# Patient Record
Sex: Female | Born: 1935 | Race: White | Hispanic: No | Marital: Married | Smoking: Never smoker
Health system: Southern US, Community
[De-identification: ages and names within clinical notes are randomized; demographics above are authoritative.]

## PROBLEM LIST (undated history)

## (undated) DIAGNOSIS — K219 Gastro-esophageal reflux disease without esophagitis: Secondary | ICD-10-CM

## (undated) DIAGNOSIS — I10 Essential (primary) hypertension: Secondary | ICD-10-CM

## (undated) DIAGNOSIS — I2510 Atherosclerotic heart disease of native coronary artery without angina pectoris: Secondary | ICD-10-CM

## (undated) DIAGNOSIS — M1990 Unspecified osteoarthritis, unspecified site: Secondary | ICD-10-CM

## (undated) DIAGNOSIS — R0602 Shortness of breath: Secondary | ICD-10-CM

## (undated) DIAGNOSIS — I219 Acute myocardial infarction, unspecified: Secondary | ICD-10-CM

## (undated) DIAGNOSIS — J189 Pneumonia, unspecified organism: Secondary | ICD-10-CM

## (undated) DIAGNOSIS — I509 Heart failure, unspecified: Secondary | ICD-10-CM

## (undated) DIAGNOSIS — E785 Hyperlipidemia, unspecified: Secondary | ICD-10-CM

## (undated) HISTORY — PX: BACK SURGERY: SHX140

## (undated) HISTORY — DX: Hyperlipidemia, unspecified: E78.5

## (undated) HISTORY — PX: CORONARY ANGIOPLASTY WITH STENT PLACEMENT: SHX49

## (undated) HISTORY — PX: OTHER SURGICAL HISTORY: SHX169

## (undated) HISTORY — PX: CORONARY ARTERY BYPASS GRAFT: SHX141

## (undated) HISTORY — PX: ABDOMINAL HYSTERECTOMY: SHX81

## (undated) HISTORY — PX: EYE SURGERY: SHX253

## (undated) HISTORY — DX: Essential (primary) hypertension: I10

---

## 2009-04-13 ENCOUNTER — Ambulatory Visit: Payer: Self-pay | Admitting: Internal Medicine

## 2009-04-13 DIAGNOSIS — R05 Cough: Secondary | ICD-10-CM

## 2009-04-13 DIAGNOSIS — J479 Bronchiectasis, uncomplicated: Secondary | ICD-10-CM

## 2009-04-13 DIAGNOSIS — E785 Hyperlipidemia, unspecified: Secondary | ICD-10-CM

## 2009-04-13 DIAGNOSIS — I10 Essential (primary) hypertension: Secondary | ICD-10-CM

## 2009-04-13 DIAGNOSIS — I4891 Unspecified atrial fibrillation: Secondary | ICD-10-CM | POA: Insufficient documentation

## 2009-04-13 DIAGNOSIS — A310 Pulmonary mycobacterial infection: Secondary | ICD-10-CM

## 2009-04-13 DIAGNOSIS — R079 Chest pain, unspecified: Secondary | ICD-10-CM

## 2010-02-15 NOTE — Assessment & Plan Note (Signed)
Summary: Pulmonary/ new pt eval   Primary Provider/Referring Provider:  Dr. Darcel Bayley at [facility] in [place], [place]  CC:  Pulmonary consult. the patient c/o cough and wheezing for several weeks.Marland Kitchen  History of Present Illness: 28 yowf never smoker with h/o cough off and on since around 2001 worse since cabg in [place] in 2007 complicated by pna and effusion and prolonged rehab.    April 13, 2009 cc variable severity daily throat  congestion and hoarseness no able to go mailbox and do house work including up and down steps unless gets in a rush.  no nocturnal c/os.   Main issue is throat congestion  and wheezing.  Larey Seat three weeks ago mild cp from fall has resolved but son concerned because she seems more wheezy and congested, even though she denies it affected her ex tol or sleep.  Pt denies any significant sore throat, dysphagia, itching, sneezing,  nasal obstruction,  fever, chills, sweats, unintended wt loss, pleuritic or exertional cp, hempoptysis, change in activity tolerance  orthopnea pnd or leg swelling.  says cloricidin and mucinex helps her more than anything else inclduing astepro per her pulmonary doctor in [place]. Pt also denies any obvious fluctuation in symptoms with weather or environmental change or other alleviating or aggravating factors.       Preventive Screening-Counseling & Management  Alcohol-Tobacco     Smoking Status: never  Current Medications (verified): 1)  Claritin 10 Mg Tabs (Loratadine) .Marland Kitchen.. 1 By Mouth Daily 2)  Vitamin D 1000 Unit Tabs (Cholecalciferol) .Marland Kitchen.. 1 By Mouth Daily 3)  Astepro 0.15 % Soln (Azelastine Hcl) .... As Needed 4)  Klor-Con M20 20 Meq Cr-Tabs (Potassium Chloride Crys Cr) .Marland Kitchen.. 1 By Mouth Two Times A Day 5)  Aspirin 81 Mg Tabs (Aspirin) .Marland Kitchen.. 1 By Mouth Daily 6)  Furosemide 40 Mg Tabs (Furosemide) .Marland Kitchen.. 1 By Mouth Daily 7)  Lipitor 20 Mg Tabs (Atorvastatin Calcium) .Marland Kitchen.. 1 By Mouth Daily 8)  Docusate Sodium 100 Mg Tabs (Docusate  Sodium) .Marland Kitchen.. 1 By Mouth Every 12 Hours 9)  Metoprolol Succinate 25 Mg Xr24h-Tab (Metoprolol Succinate) .Marland Kitchen.. 1 By Mouth Every 12 Hours 10)  Omeprazole 20 Mg Cpdr (Omeprazole) .... 2 By Mouth Daily 11)  Evista 60 Mg Tabs (Raloxifene Hcl) .Marland Kitchen.. 1 By Mouth Daily 12)  Levothyroxine Sodium 75 Mcg Tabs (Levothyroxine Sodium) .Marland Kitchen.. 1 By Mouth Daily 13)  Calcium Antacid Extra Strength 750 Mg Chew (Calcium Carbonate Antacid) .... 2 By Mouth Daily  Allergies (verified): 1)  ! 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) HYPERTENSION (ICD-401.9) HYPERLIPIDEMIA (ICD-272.4) Hx of ATRIAL FIBRILLATION (ICD-427.31) UPPER AIRWAY COUGH SYNDROME     - onset around 2001  Family History: Negative for respiratory diseases or atopy  heart dz father  Social History: Never smoker  Lives with husband able to go mailbox and do house work including up and down stepsSmoking Status:  never  Vital Signs:  Patient profile:   77 year old female Height:      61 inches (154.94 cm) Weight:      150 pounds (68.18 kg) BMI:     28.44 O2 Sat:      92 % on Room air Temp:     98.0 degrees F (36.67 degrees C) oral Pulse rate:   53 / minute BP sitting:   122 / 72  (left arm) Cuff size:   regular  Vitals Entered By: Michel Bickers CMA (April 13, 2009 11:52 AM)  O2 Sat at Rest %:  92 O2 Flow:  Room air CC: Pulmonary consult. the patient c/o cough and wheezing for several weeks.   Physical Exam  Additional Exam:  moderately frail wf who  failed to answer a single question asked in a straightforward manner, tending to go off on tangents or answer questions with ambiguous medical terms or diagnoses and seemed perturbed when asked the same question more than once for clarification and changed her chief complaint several times during the initial interview. HEENT: nl dentition, turbinates, and orophanx. Nl external ear canals without cough reflex NECK :  without JVD/Nodes/TM/ nl  carotid upstrokes bilaterally LUNGS: no acc muscle use, clear to A and P bilaterally without cough on insp or exp maneuvers CV:  RRR  no s3 or murmur or increase in P2, no edema  ABD:  soft and nontender with nl excursion in the supine position. No bruits or organomegaly, bowel sounds nl MS:  warm without deformities, calf tenderness, cyanosis or clubbing SKIN: warm and dry without lesions   NEURO:  alert, approp, no deficits     CXR  Procedure date:  04/13/2009  Findings:        Findings: The cardiac silhouette is upper limits of normal in size. The mediastinum and pulmonary vasculature are within normal limits. There is evidence of prior sternotomy and CABG.  Bronchiectasis and pulmonary interstitial fibrotic changes are noted, especially in the lower lungs.  There is left basilar pleural thickening.  The bones are osteopenic, and there are several vertebral wedge compression fractures, age indeterminate.  Atherosclerotic calcification is present within the thoracic aorta.   IMPRESSION: There is bronchiectasis and interstitial fibrotic change, especially in the bases with left lateral pleural thickening/scarring.  Impression & Recommendations:  Problem # 1:  COUGH (ICD-786.2) The most common causes of chronic cough in immunocompetent adults include: upper airway cough syndrome (UACS), previously referred to as postnasal drip syndrome,  caused by variety of rhinosinus conditions; (2) asthma; (3) GERD; (4) chronic bronchitis from cigarette smoking or other inhaled environmental irritants; (5) nonasthmatic eosinophilic bronchitis; and (6) bronchiectasis. These conditions, singly or in combination, have accounted for up to 94% of the causes of chronic cough in prospective studies.   This is most c/w Upper airway cough syndrome, so named because it's frequently impossible to sort out how much is LPR vs CR/sinusitis with freq throat clearing generating secondary extra esophageal GERD  from wide swings in gastric pressure that occur with throat clearing, promoting self use of mint and menthol lozenges that reduce the lower esophageal sphincter tone and exacerbate the problem further.  These symptoms are easily confused with asthma/copd by even experienced pulmonogists because they overlap so much. These are the same pts who not infrequently have failed to tolerate ace inhibitors,  dry powder inhalers or biphosphonates or report having reflux symptoms that don't respond to standard doses of PPI  For now try max rx at the acid component of gerd and add H1 blocker per cough guidelines.  Problem # 2:  CHEST PAIN (ICD-786.50) s/p blunt injury x 3 weeks no findings on exam and no acute changes on cxr so no need for additional w/u at this point  Problem # 3:  BRONCHIECTASIS W/O ACUTE EXACERBATION (ICD-494.0) Chest xray and hx are classic for bronchiectasis except that most of her coughing is upper airway in pattern which might be sinus related. If symtpoms continue would consider sinus ct and maybe chest ct/pft's but only if symptoms become more  limiting than what she is describing.  Medications Added to Medication List This Visit: 1)  Claritin 10 Mg Tabs (Loratadine) .Marland Kitchen.. 1 by mouth daily 2)  Vitamin D 1000 Unit Tabs (Cholecalciferol) .Marland Kitchen.. 1 by mouth daily 3)  Astepro 0.15 % Soln (Azelastine hcl) .... As needed 4)  Klor-con M20 20 Meq Cr-tabs (Potassium chloride crys cr) .Marland Kitchen.. 1 by mouth two times a day 5)  Aspirin 81 Mg Tabs (Aspirin) .Marland Kitchen.. 1 by mouth daily 6)  Furosemide 40 Mg Tabs (Furosemide) .Marland Kitchen.. 1 by mouth daily 7)  Lipitor 20 Mg Tabs (Atorvastatin calcium) .Marland Kitchen.. 1 by mouth daily 8)  Docusate Sodium 100 Mg Tabs (Docusate sodium) .Marland Kitchen.. 1 by mouth every 12 hours 9)  Metoprolol Succinate 25 Mg Xr24h-tab (Metoprolol succinate) .Marland Kitchen.. 1 by mouth every 12 hours 10)  Omeprazole 20 Mg Cpdr (Omeprazole) .... Take  two pills 30-60 min before first meal of the day 11)  Evista 60 Mg Tabs  (Raloxifene hcl) .Marland Kitchen.. 1 by mouth daily 12)  Levothyroxine Sodium 75 Mcg Tabs (Levothyroxine sodium) .Marland Kitchen.. 1 by mouth daily 13)  Calcium Antacid Extra Strength 750 Mg Chew (Calcium carbonate antacid) .... 2 by mouth daily 14)  Chlor-trimeton 4 Mg Tabs (Chlorpheniramine maleate) .... One every 6 hours as needed when feel throat drainage or congestion  Other Orders: New Patient Level V (57322) T-2 View CXR (71020TC)  Patient Instructions: 1)  for drainage congestion and cough I recommend you increase omeprazole to 20 mg 2 pills each am 30-60 min before first meal of the day and as needed use chlortrimeton 4 mg every 6 hours instead of coricidin or mucinex 2)  GERD (REFLUX)  is a common cause of respiratory symptoms. It commonly presents without heartburn and can be treated with medication, but also with lifestyle changes including avoidance of late meals, excessive alcohol, smoking cessation, and avoid fatty foods, chocolate, peppermint, colas, red wine, and acidic juices such as orange juice. NO MINT OR MENTHOL PRODUCTS SO NO COUGH DROPS  3)  USE SUGARLESS CANDY INSTEAD (jolley ranchers)  4)  NO OIL BASED VITAMINS

## 2011-12-27 ENCOUNTER — Ambulatory Visit: Payer: Medicare Other

## 2011-12-27 ENCOUNTER — Ambulatory Visit (INDEPENDENT_AMBULATORY_CARE_PROVIDER_SITE_OTHER): Payer: Medicare Other | Admitting: Family Medicine

## 2011-12-27 VITALS — BP 128/62 | HR 60 | Temp 98.6°F | Resp 16 | Ht 60.0 in | Wt 146.6 lb

## 2011-12-27 DIAGNOSIS — R05 Cough: Secondary | ICD-10-CM

## 2011-12-27 DIAGNOSIS — R059 Cough, unspecified: Secondary | ICD-10-CM

## 2011-12-27 DIAGNOSIS — J40 Bronchitis, not specified as acute or chronic: Secondary | ICD-10-CM

## 2011-12-27 LAB — POCT CBC
Granulocyte percent: 76.8 %G (ref 37–80)
Hemoglobin: 12.9 g/dL (ref 12.2–16.2)
MCHC: 30.9 g/dL — AB (ref 31.8–35.4)
MID (cbc): 0.5 (ref 0–0.9)
MPV: 9.5 fL (ref 0–99.8)
POC Granulocyte: 8.9 — AB (ref 2–6.9)
POC MID %: 4.6 %M (ref 0–12)
RBC: 4.3 M/uL (ref 4.04–5.48)
WBC: 11.6 10*3/uL — AB (ref 4.6–10.2)

## 2011-12-27 MED ORDER — CEFDINIR 300 MG PO CAPS: 300.0000 mg | ORAL_CAPSULE | Freq: Two times a day (BID) | ORAL | Status: DC

## 2011-12-27 MED ORDER — BENZONATATE 100 MG PO CAPS: 100.0000 mg | ORAL_CAPSULE | Freq: Three times a day (TID) | ORAL | Status: DC | PRN

## 2011-12-27 NOTE — Patient Instructions (Addendum)
We are going to treat you for mild pneumonia with omnicef- an antibiotic.  Take it twice a day.  If you are not better in the next 48 hours please see [place] or your regular doctor.    Your EKG (heart tracing) does show some changes from the past.  I am going to have one of the cardiologists from [place] call me back and give me their opinion.  I will give you a call with their advice.

## 2011-12-27 NOTE — Progress Notes (Signed)
[facility] [address], [place] [address] [phone]  Date:  12/27/2011   Name:  Tina Good   DOB:  [birth year]   MRN:  [id number]  PCP:  No primary provider on file.    Chief Complaint: Cough, Back Pain, Dizziness, Headache, Otalgia, Neck Pain and Shortness of Breath   History of Present Illness:  Tina Good is a 78 y.o. very pleasant female patient who presents with the following:  She is here today because "I've got a cold and I need a Z- pack." She noted onset of symptoms about 2 days ago.   She feels "off balance" and congested.   She does have a cough. The cough can be productive.   Also has noted a ST, earache, and sore nodes in her neck.   She thinks she has had a fever but it does not sound like she has been checking her temperature- she thinks her temperature has been around 100 degrees.   She actually denies having any CP.   She also denies feeling short- winded.   He cough "sounds croupy."   She has used mucinex and chloracedin OTC.    Latrecia's PCP is in [place], [facility].   PCP Dr Ladonna Snide, Bretta Bang is the NP there who usually sees her.  [phone]  Dr. Jennette Bill in [place] is her cardiologist.  She had CABG in approx 2007 and also has a history of HTN, a fib, hyperlipidemia.    Tina Good lives in [place], [place] but her son brought he here today due to illness  Patient Active Problem List  Diagnosis  . M A I/MYCOBACTERIAL DISEASE  . HYPERLIPIDEMIA  . HYPERTENSION  . ATRIAL FIBRILLATION  . BRONCHIECTASIS W/O ACUTE EXACERBATION  . COUGH  . CHEST PAIN    Past Medical History  Diagnosis Date  . Hypertension   . Hyperlipidemia     Past Surgical History  Procedure Date  . Abdominal hysterectomy   . Cardiac bypass     History  Substance Use Topics  . Smoking status: Never Smoker   . Smokeless tobacco: Not on file  . Alcohol Use: Not on file    No family history on file.  Allergies not on  file  Medication list has been reviewed and updated.  Current Outpatient Prescriptions on File Prior to Visit  Medication Sig Dispense Refill  . atorvastatin (LIPITOR) 20 MG tablet Take 20 mg by mouth daily.      Tina Good Kitchen levothyroxine (SYNTHROID, LEVOTHROID) 75 MCG tablet Take 75 mcg by mouth daily.      . metoprolol tartrate (LOPRESSOR) 25 MG tablet Take 25 mg by mouth 2 (two) times daily.      . potassium chloride (KLOR-CON) 8 MEQ tablet Take 8 mEq by mouth 2 (two) times daily.        Review of Systems:  As per HPI- otherwise negative.   Physical Examination: Filed Vitals:   12/27/11 1133  BP: 128/62  Pulse: 60  Temp: 98.6 F (37 C)  Resp: 16   Filed Vitals:   12/27/11 1133  Height: 5' (1.524 m)  Weight: 146 lb 9.6 oz (66.497 kg)   Body mass index is 28.63 kg/(m^2). Ideal Body Weight: Weight in (lb) to have BMI = 25: 127.7   GEN: WDWN, NAD, Non-toxic, A & O x 3, somewhat HOH, significant kyphosis  HEENT: Atraumatic, Normocephalic. Neck supple. No masses, No LAD. Bilateral TM wnl, oropharynx normal.  PEERL,EOMI.  Ears and Nose: No external deformity. CV: RRR, No M/G/R. No JVD. No thrill. No extra heart sounds. PULM: CTA B, no wheezes, crackles, rhonchi. No retractions. No resp. distress. No accessory muscle use. ABD: S, NT, ND EXTR: No c/c/e NEURO Normal gait.  PSYCH: Normally interactive. Elderly but not especially frail. Appropriate for age.    [facility] reading (PRIMARY) by  Dr. Patsy Lager. Right sided patchy infiltrate. History of CABG  Clinical Data: Cough and shortness of breath.  CHEST - 2 VIEW  Comparison: No priors.  Findings: Mild elevation of the left hemidiaphragm. Slight prominence of interstitial markings in the lung bases, likely reflective of areas of mild scarring and/or subsegmental atelectasis. Mild bronchial wall thickening predominately in the right lower lobe with some interstitial prominence, which could suggest a very mild bronchopneumonia. Lungs  are otherwise clear. No pleural effusions. No evidence of pulmonary edema. Heart size is borderline enlarged. Mediastinal contours are unremarkable. Severe mitral annular calcifications. Atherosclerosis in the thoracic aorta. Status post median sternotomy for CABG.  IMPRESSION: 1. Findings concerning for early bronchopneumonia in the right lower lobe. 2. Borderline cardiomegaly. 3. Atherosclerosis. 4. Severe mitral annular calcifications.  EKG: she has a Left bundle branch block and Q in III- called her cardiology office in [place] [phone].  No physician available in the office but they are faxing me an old EKG for review.  Old EKG from March also shows a Left BBB, and she has new downgoing T waves/ ST depression V5/ V6. Asked pt and her son several times- she has not had any CP after all.    Was able to speak with her cardiologist Dr. Ann Maki after he returned to the office- he reviewed her EKG and noted no change, due to left BBB EKG is of limited utility.   Results for orders placed in visit on 12/27/11  POCT CBC      Component Value Range   WBC 11.6 (*) 4.6 - 10.2 K/uL   Lymph, poc 2.2  0.6 - 3.4   POC LYMPH PERCENT 18.6  10 - 50 %L   MID (cbc) 0.5  0 - 0.9   POC MID % 4.6  0 - 12 %M   POC Granulocyte 8.9 (*) 2 - 6.9   Granulocyte percent 76.8  37 - 80 %G   RBC 4.30  4.04 - 5.48 M/uL   Hemoglobin 12.9  12.2 - 16.2 g/dL   HCT, POC 40.9  81.1 - 47.9 %   MCV 97.0  80 - 97 fL   MCH, POC 30.0  27 - 31.2 pg   MCHC 30.9 (*) 31.8 - 35.4 g/dL   RDW, POC 91.4     Platelet Count, POC 159  142 - 424 K/uL   MPV 9.5  0 - 99.8 fL    Assessment and Plan: 1. Cough  POCT CBC, DG Chest 2 View, EKG 12-Lead, benzonatate (TESSALON) 100 MG capsule  2. Bronchitis  cefdinir (OMNICEF) 300 MG capsule   Tina Good is a new patient here today with chest congestion and cough, CXR as above shows possible early pneumonia. This would explain her congestion and slight decrease in her O2 saturation.   Discussed her EKG with Tina Good and her sons.  It is non- diagnostic, but as she is not having any CP we will treat her for infection in her lungs.  However, they will be sure to seek care if there is any worsening or lack of improvement.   Treat for mild pneumonia/  bronchitis with omnicef and tessalon prn cough.   Sons Jennette Kettle and [phone] Meds ordered this encounter  Medications  . finasteride (PROPECIA) 1 MG tablet    Sig: Take 1 mg by mouth daily.  Tina Good Kitchen aspirin 81 MG tablet    Sig: Take 81 mg by mouth daily.  . raloxifene (EVISTA) 60 MG tablet    Sig: Take 60 mg by mouth daily.  . potassium chloride (KLOR-CON) 8 MEQ tablet    Sig: Take 8 mEq by mouth 2 (two) times daily.  Tina Good Kitchen levothyroxine (SYNTHROID, LEVOTHROID) 75 MCG tablet    Sig: Take 75 mcg by mouth daily.  Tina Good Kitchen atorvastatin (LIPITOR) 20 MG tablet    Sig: Take 20 mg by mouth daily.  . metoprolol tartrate (LOPRESSOR) 25 MG tablet    Sig: Take 25 mg by mouth 2 (two) times daily.  . cefdinir (OMNICEF) 300 MG capsule    Sig: Take 1 capsule (300 mg total) by mouth 2 (two) times daily.    Dispense:  20 capsule    Refill:  0  . benzonatate (TESSALON) 100 MG capsule    Sig: Take 1 capsule (100 mg total) by mouth 3 (three) times daily as needed for cough.    Dispense:  40 capsule    Refill:  0    COPLAND,JESSICA, MD

## 2011-12-28 ENCOUNTER — Telehealth: Payer: Self-pay | Admitting: Family Medicine

## 2011-12-28 NOTE — Telephone Encounter (Signed)
Called to check on her- she is feeling ok.  I will send her a copy of her CXR report which she is to take by her cardiologist's office secondary to mitral valve calcifications.  Suspect this is not at all new.

## 2012-01-14 ENCOUNTER — Inpatient Hospital Stay (HOSPITAL_COMMUNITY)
Admission: EM | Admit: 2012-01-14 | Discharge: 2012-01-18 | DRG: 193 | Disposition: A | Discharge status: Home / Self Care | Payer: Medicare Other | Attending: Internal Medicine | Admitting: Internal Medicine

## 2012-01-14 ENCOUNTER — Encounter (HOSPITAL_COMMUNITY): Payer: Self-pay | Admitting: *Deleted

## 2012-01-14 ENCOUNTER — Emergency Department (HOSPITAL_COMMUNITY): Payer: Medicare Other

## 2012-01-14 DIAGNOSIS — I5033 Acute on chronic diastolic (congestive) heart failure: Secondary | ICD-10-CM | POA: Diagnosis present

## 2012-01-14 DIAGNOSIS — R05 Cough: Secondary | ICD-10-CM | POA: Diagnosis present

## 2012-01-14 DIAGNOSIS — J479 Bronchiectasis, uncomplicated: Secondary | ICD-10-CM

## 2012-01-14 DIAGNOSIS — J1000 Influenza due to other identified influenza virus with unspecified type of pneumonia: Principal | ICD-10-CM | POA: Diagnosis present

## 2012-01-14 DIAGNOSIS — I447 Left bundle-branch block, unspecified: Secondary | ICD-10-CM

## 2012-01-14 DIAGNOSIS — K5900 Constipation, unspecified: Secondary | ICD-10-CM | POA: Diagnosis present

## 2012-01-14 DIAGNOSIS — M810 Age-related osteoporosis without current pathological fracture: Secondary | ICD-10-CM | POA: Diagnosis present

## 2012-01-14 DIAGNOSIS — I2510 Atherosclerotic heart disease of native coronary artery without angina pectoris: Secondary | ICD-10-CM

## 2012-01-14 DIAGNOSIS — K219 Gastro-esophageal reflux disease without esophagitis: Secondary | ICD-10-CM | POA: Diagnosis present

## 2012-01-14 DIAGNOSIS — Z8701 Personal history of pneumonia (recurrent): Secondary | ICD-10-CM

## 2012-01-14 DIAGNOSIS — I4891 Unspecified atrial fibrillation: Secondary | ICD-10-CM

## 2012-01-14 DIAGNOSIS — I10 Essential (primary) hypertension: Secondary | ICD-10-CM | POA: Diagnosis present

## 2012-01-14 DIAGNOSIS — E876 Hypokalemia: Secondary | ICD-10-CM | POA: Diagnosis present

## 2012-01-14 DIAGNOSIS — A310 Pulmonary mycobacterial infection: Secondary | ICD-10-CM

## 2012-01-14 DIAGNOSIS — I509 Heart failure, unspecified: Secondary | ICD-10-CM | POA: Diagnosis present

## 2012-01-14 DIAGNOSIS — Z7982 Long term (current) use of aspirin: Secondary | ICD-10-CM

## 2012-01-14 DIAGNOSIS — R079 Chest pain, unspecified: Secondary | ICD-10-CM

## 2012-01-14 DIAGNOSIS — J189 Pneumonia, unspecified organism: Secondary | ICD-10-CM

## 2012-01-14 DIAGNOSIS — E785 Hyperlipidemia, unspecified: Secondary | ICD-10-CM | POA: Diagnosis present

## 2012-01-14 DIAGNOSIS — R55 Syncope and collapse: Secondary | ICD-10-CM

## 2012-01-14 HISTORY — DX: Heart failure, unspecified: I50.9

## 2012-01-14 HISTORY — DX: Gastro-esophageal reflux disease without esophagitis: K21.9

## 2012-01-14 HISTORY — DX: Acute myocardial infarction, unspecified: I21.9

## 2012-01-14 HISTORY — DX: Atherosclerotic heart disease of native coronary artery without angina pectoris: I25.10

## 2012-01-14 HISTORY — DX: Pneumonia, unspecified organism: J18.9

## 2012-01-14 HISTORY — DX: Unspecified osteoarthritis, unspecified site: M19.90

## 2012-01-14 HISTORY — DX: Shortness of breath: R06.02

## 2012-01-14 LAB — COMPREHENSIVE METABOLIC PANEL
ALT: 15 U/L (ref 0–35)
AST: 21 U/L (ref 0–37)
Albumin: 3.4 g/dL — ABNORMAL LOW (ref 3.5–5.2)
Calcium: 9 mg/dL (ref 8.4–10.5)
Creatinine, Ser: 0.77 mg/dL (ref 0.50–1.10)
Sodium: 137 mEq/L (ref 135–145)

## 2012-01-14 LAB — CBC WITH DIFFERENTIAL/PLATELET
Basophils Absolute: 0 10*3/uL (ref 0.0–0.1)
Basophils Relative: 0 % (ref 0–1)
Eosinophils Relative: 0 % (ref 0–5)
HCT: 35.9 % — ABNORMAL LOW (ref 36.0–46.0)
MCHC: 33.4 g/dL (ref 30.0–36.0)
Monocytes Absolute: 0.5 10*3/uL (ref 0.1–1.0)
Neutro Abs: 6.2 10*3/uL (ref 1.7–7.7)
Platelets: 154 10*3/uL (ref 150–400)
RDW: 12.8 % (ref 11.5–15.5)
WBC: 7.3 10*3/uL (ref 4.0–10.5)

## 2012-01-14 LAB — LIPASE, BLOOD: Lipase: 13 U/L (ref 11–59)

## 2012-01-14 MED ORDER — POTASSIUM CHLORIDE CRYS ER 20 MEQ PO TBCR
40.0000 meq | EXTENDED_RELEASE_TABLET | Freq: Once | ORAL | Status: AC
  Administered 2012-01-14: 40 meq via ORAL
  Filled 2012-01-14: qty 2

## 2012-01-14 MED ORDER — ALBUTEROL SULFATE (5 MG/ML) 0.5% IN NEBU
2.5000 mg | INHALATION_SOLUTION | Freq: Four times a day (QID) | RESPIRATORY_TRACT | Status: DC
  Administered 2012-01-15 – 2012-01-16 (×6): 2.5 mg via RESPIRATORY_TRACT
  Filled 2012-01-14 (×6): qty 0.5

## 2012-01-14 MED ORDER — FUROSEMIDE 40 MG PO TABS
40.0000 mg | ORAL_TABLET | Freq: Every day | ORAL | Status: DC
  Administered 2012-01-14 – 2012-01-16 (×3): 40 mg via ORAL
  Filled 2012-01-14 (×3): qty 1

## 2012-01-14 MED ORDER — FUROSEMIDE 10 MG/ML IJ SOLN
80.0000 mg | Freq: Once | INTRAMUSCULAR | Status: AC
  Administered 2012-01-14: 80 mg via INTRAVENOUS
  Filled 2012-01-14: qty 8

## 2012-01-14 MED ORDER — POTASSIUM CHLORIDE CRYS ER 20 MEQ PO TBCR
20.0000 meq | EXTENDED_RELEASE_TABLET | Freq: Two times a day (BID) | ORAL | Status: DC
  Administered 2012-01-15 – 2012-01-16 (×5): 20 meq via ORAL
  Filled 2012-01-14 (×7): qty 1

## 2012-01-14 MED ORDER — SODIUM CHLORIDE 0.9 % IV SOLN: 250.0000 mL | INTRAVENOUS | Status: DC | PRN

## 2012-01-14 MED ORDER — ADULT MULTIVITAMIN W/MINERALS CH
1.0000 | ORAL_TABLET | Freq: Every day | ORAL | Status: DC
  Administered 2012-01-15 – 2012-01-18 (×5): 1 via ORAL
  Filled 2012-01-14 (×5): qty 1

## 2012-01-14 MED ORDER — METOPROLOL TARTRATE 25 MG PO TABS
25.0000 mg | ORAL_TABLET | Freq: Two times a day (BID) | ORAL | Status: DC
  Administered 2012-01-15 – 2012-01-18 (×8): 25 mg via ORAL
  Filled 2012-01-14 (×9): qty 1

## 2012-01-14 MED ORDER — ONDANSETRON HCL 4 MG PO TABS: 4.0000 mg | ORAL_TABLET | Freq: Four times a day (QID) | ORAL | Status: DC | PRN

## 2012-01-14 MED ORDER — ONDANSETRON HCL 4 MG/2ML IJ SOLN: 4.0000 mg | Freq: Four times a day (QID) | INTRAMUSCULAR | Status: DC | PRN

## 2012-01-14 MED ORDER — SODIUM CHLORIDE 0.9 % IV BOLUS (SEPSIS)
500.0000 mL | INTRAVENOUS | Status: AC
  Administered 2012-01-14: 500 mL via INTRAVENOUS

## 2012-01-14 MED ORDER — ATORVASTATIN CALCIUM 20 MG PO TABS
20.0000 mg | ORAL_TABLET | Freq: Every day | ORAL | Status: DC
  Administered 2012-01-14 – 2012-01-18 (×5): 20 mg via ORAL
  Filled 2012-01-14 (×5): qty 1

## 2012-01-14 MED ORDER — SODIUM CHLORIDE 0.9 % IJ SOLN
3.0000 mL | Freq: Two times a day (BID) | INTRAMUSCULAR | Status: DC
  Administered 2012-01-15 – 2012-01-18 (×8): 3 mL via INTRAVENOUS

## 2012-01-14 MED ORDER — PANTOPRAZOLE SODIUM 40 MG PO TBEC
40.0000 mg | DELAYED_RELEASE_TABLET | Freq: Every day | ORAL | Status: DC
  Administered 2012-01-14 – 2012-01-18 (×5): 40 mg via ORAL
  Filled 2012-01-14 (×5): qty 1

## 2012-01-14 MED ORDER — LEVOTHYROXINE SODIUM 75 MCG PO TABS
75.0000 ug | ORAL_TABLET | Freq: Every day | ORAL | Status: DC
  Administered 2012-01-15 – 2012-01-18 (×4): 75 ug via ORAL
  Filled 2012-01-14 (×5): qty 1

## 2012-01-14 MED ORDER — DOCUSATE SODIUM 100 MG PO CAPS
100.0000 mg | ORAL_CAPSULE | ORAL | Status: DC
  Administered 2012-01-15 – 2012-01-17 (×2): 100 mg via ORAL
  Filled 2012-01-14 (×4): qty 1

## 2012-01-14 MED ORDER — BENZONATATE 100 MG PO CAPS
100.0000 mg | ORAL_CAPSULE | Freq: Three times a day (TID) | ORAL | Status: DC | PRN
  Filled 2012-01-14: qty 1

## 2012-01-14 MED ORDER — GUAIFENESIN ER 600 MG PO TB12
600.0000 mg | ORAL_TABLET | Freq: Every day | ORAL | Status: DC | PRN
  Filled 2012-01-14: qty 1

## 2012-01-14 MED ORDER — ASPIRIN 81 MG PO CHEW
81.0000 mg | CHEWABLE_TABLET | Freq: Every day | ORAL | Status: DC
  Administered 2012-01-15 – 2012-01-18 (×4): 81 mg via ORAL
  Filled 2012-01-14 (×4): qty 1

## 2012-01-14 MED ORDER — RALOXIFENE HCL 60 MG PO TABS
60.0000 mg | ORAL_TABLET | Freq: Every day | ORAL | Status: DC
  Administered 2012-01-15 – 2012-01-18 (×5): 60 mg via ORAL
  Filled 2012-01-14 (×5): qty 1

## 2012-01-14 MED ORDER — ENOXAPARIN SODIUM 40 MG/0.4ML ~~LOC~~ SOLN
40.0000 mg | SUBCUTANEOUS | Status: DC
  Administered 2012-01-14 – 2012-01-17 (×4): 40 mg via SUBCUTANEOUS
  Filled 2012-01-14 (×5): qty 0.4

## 2012-01-14 MED ORDER — ALBUTEROL SULFATE (5 MG/ML) 0.5% IN NEBU: 2.5000 mg | INHALATION_SOLUTION | RESPIRATORY_TRACT | Status: DC | PRN

## 2012-01-14 MED ORDER — ASPIRIN 81 MG PO TABS: 81.0000 mg | ORAL_TABLET | Freq: Every day | ORAL | Status: DC

## 2012-01-14 NOTE — ED Provider Notes (Signed)
History     CSN: [id number]  Arrival date & time 01/14/12  1257   First MD Initiated Contact with Patient 01/14/12 1522      Chief Complaint  Patient presents with  . URI  . Emesis  . Loss of Consciousness    (Consider location/radiation/quality/duration/timing/severity/associated sxs/prior treatment) HPI The patient presents with concerns of ongoing cough, nausea, generalized weakness.  Approximately 2 weeks ago she diagnosed with pneumonia, since that time she has completed a course of antibiotics.  She notes over the past days she continues to have persistent cough.  Yesterday she developed nausea, had one episode of emesis, and a notable bowel movement.  She subsequently lost consciousness, briefly. Upon awakening she was back in her usual state of health, appropriately interactive, stating that she felt better with no subsequent chest pain or dyspnea. Since last night she continues to have generalized weakness, persistent nausea, though with no new episodes of emesis.  She denies dyspnea today, but notes that over the past days as her weakness has progressed she has had dyspnea.  She denies additional episodes of loss of consciousness. Today the patient denies any chest pain, or focal pain anywhere.  She does endorse nausea, generalized weakness chills.  She denies confusion, disorientation. History of present illness is per the patient and her sons.   Past Medical History  Diagnosis Date  . Hypertension   . Hyperlipidemia   . Pneumonia     Past Surgical History  Procedure Date  . Abdominal hysterectomy   . Cardiac bypass     No family history on file.  History  Substance Use Topics  . Smoking status: Never Smoker   . Smokeless tobacco: Not on file  . Alcohol Use: No    OB History    Grav Para Term Preterm Abortions TAB SAB Ect Mult Living                  Review of Systems  Constitutional:       Per HPI, otherwise negative  HENT:       Per HPI, otherwise  negative  Eyes: Negative.   Respiratory:       Per HPI, otherwise negative  Cardiovascular:       Per HPI, otherwise negative  Gastrointestinal: Positive for vomiting.  Genitourinary: Negative.   Musculoskeletal:       Per HPI, otherwise negative  Skin: Negative.   Neurological: Negative for syncope.    Allergies  Review of patient's allergies indicates no known allergies.  Home Medications   Current Outpatient Rx  Name  Route  Sig  Dispense  Refill  . ASPIRIN 81 MG PO TABS   Oral   Take 81 mg by mouth daily.         . ATORVASTATIN CALCIUM 20 MG PO TABS   Oral   Take 20 mg by mouth daily.         Marland Kitchen BENZONATATE 100 MG PO CAPS   Oral   Take 1 capsule (100 mg total) by mouth 3 (three) times daily as needed for cough.   40 capsule   0   . CEFDINIR 300 MG PO CAPS   Oral   Take 1 capsule (300 mg total) by mouth 2 (two) times daily.   20 capsule   0   . FINASTERIDE 1 MG PO TABS   Oral   Take 1 mg by mouth daily.         Marland Kitchen LEVOTHYROXINE SODIUM 75  MCG PO TABS   Oral   Take 75 mcg by mouth daily.         Marland Kitchen METOPROLOL TARTRATE 25 MG PO TABS   Oral   Take 25 mg by mouth 2 (two) times daily.         Marland Kitchen RALOXIFENE HCL 60 MG PO TABS   Oral   Take 60 mg by mouth daily.           BP 138/49  Pulse 66  Temp 100.4 F (38 C) (Oral)  Resp 24  Ht 4\' 10"  (1.473 m)  Wt 144 lb (65.318 kg)  BMI 30.10 kg/m2  SpO2 94%  Physical Exam  Nursing note and vitals reviewed. Constitutional: She is oriented to person, place, and time. She appears well-developed and well-nourished. No distress.  HENT:  Head: Normocephalic and atraumatic.  Eyes: Conjunctivae normal and EOM are normal.  Cardiovascular: Normal rate and regular rhythm.   Pulmonary/Chest: No stridor. Tachypnea noted. She has decreased breath sounds.  Abdominal: She exhibits no distension.  Musculoskeletal: She exhibits no edema.  Neurological: She is alert and oriented to person, place, and time. No  cranial nerve deficit.  Skin: Skin is warm and dry.  Psychiatric: She has a normal mood and affect.    ED Course  Procedures (including critical care time)  Labs Reviewed  CBC WITH DIFFERENTIAL - Abnormal; Notable for the following:    HCT 35.9 (*)     Neutrophils Relative 86 (*)     Lymphocytes Relative 8 (*)     Lymphs Abs 0.6 (*)     All other components within normal limits  COMPREHENSIVE METABOLIC PANEL - Abnormal; Notable for the following:    Potassium 3.4 (*)     Glucose, Bld 156 (*)     Albumin 3.4 (*)     GFR calc non Af Amer 74 (*)     GFR calc Af Amer 86 (*)     All other components within normal limits  LIPASE, BLOOD  PRO B NATRIURETIC PEPTIDE   Dg Chest 2 View  01/14/2012  *RADIOLOGY REPORT*  Clinical Data: Upper respiratory infection.  Vomiting.  Loss of consciousness.  CHEST - 2 VIEW  Comparison: 04/13/2009  Findings: There is mild chronic cardiomegaly.  Pulmonary vascularity is normal.  The patient has chronic interstitial disease with scarring in the left lung base which is stable.  No effusions.  Diffuse osteopenia with old compression fractures in the thoracic and lumbar spine, stable.  IMPRESSION: No acute abnormality.   Original Report Authenticated By: Francene Boyers, M.D.      No diagnosis found.   Date: 01/14/2012  Rate: 67  Rhythm: normal sinus rhythm  QRS Axis: left  Intervals: PR prolonged  ST/T Wave abnormalities: nonspecific ST/T changes  Conduction Disutrbances:left bundle branch block  Narrative Interpretation:   Old EKG Reviewed: unchanged Compared to 12/27/11 - no sig changes.  Cardiac: 70 sr, normal  O2- 95%ra, borderline   4:56 PM After initially denying any Hx of CHF, the patient now endorses this Hx.    MDM  Past female presents with concerns of ongoing cough and generalized weakness, with new episode of syncope, and vomiting.  On exam the patient is moderately hypoxic, tachypneic.  There is no gross edema, but given her  history of dyspnea and fatigue, with the hypoxia CHF with an initial consideration.  Initially the patient denied this as a medical issue, though she acknowledges this later.  The patient's labs notable  for a BNP of greater than 2000.  The patient received Lasix here, was admitted for further evaluation and management of her syncopal episode with heart failure exacerbation.  Gerhard Munch, MD 01/14/12 [phone]

## 2012-01-14 NOTE — ED Notes (Signed)
Patient has hx of pneumonia and antibiotics.

## 2012-01-14 NOTE — H&P (Addendum)
[facility] History and Physical  Tina Good EAV:[id number] DOB: 1924/07/24 DOA: 01/14/2012  Referring physician: er PCP: No primary provider on file.  Specialists:   Chief Complaint: cough  HPI: Tina Good is a 78 y.o. female who is a poor historian and family had left at the time of my exam.  She did have a recent bout with PNA- she was treated with abx but has had continued cough since then.  Cough is so bad it causes her to vomit and lose consciousness.  Patient thinks her cough is finally "breaking up"  Upon reviewing her chart: she saw Dr. Sherene Sires in 2011 with persistent cough after her CABG- he attributed it to Upper airway cough syndrome.    In the ER, she was found to have an elevated BNP- but no fluid on chest x ray.  She was initially treated with IVF but then given lasix.     Review of Systems: all systems reviewed, negative unless states above  Past Medical History  Diagnosis Date  . Hypertension   . Hyperlipidemia   . Pneumonia   . CHF (congestive heart failure)    Past Surgical History  Procedure Date  . Abdominal hysterectomy   . Cardiac bypass    Social History:  reports that she has never smoked. She does not have any smokeless tobacco history on file. She reports that she does not drink alcohol or use illicit drugs.   No Known Allergies  Family Hx: +CAD  Prior to Admission medications   Medication Sig Start Date End Date Taking? Authorizing Provider  aspirin 81 MG tablet Take 81 mg by mouth daily.   Yes Historical Provider, MD  atorvastatin (LIPITOR) 20 MG tablet Take 20 mg by mouth daily.   Yes Historical Provider, MD  benzonatate (TESSALON) 100 MG capsule Take 100 mg by mouth 3 (three) times daily as needed. For cough 12/27/11  Yes Gwenlyn Found Copland, MD  calcium carbonate (TUMS EX) 750 MG chewable tablet Chew 1 tablet by mouth 2 (two) times daily.   Yes Historical Provider, MD  cefdinir (OMNICEF) 300 MG capsule Take 1 capsule (300 mg total) by  mouth 2 (two) times daily. 12/27/11  Yes Gwenlyn Found Copland, MD  Cholecalciferol (VITAMIN D-3 PO) Take 1 tablet by mouth daily.   Yes Historical Provider, MD  docusate sodium (COLACE) 100 MG capsule Take 100 mg by mouth 3 (three) times a week.   Yes Historical Provider, MD  furosemide (LASIX) 40 MG tablet Take 40 mg by mouth daily.   Yes Historical Provider, MD  guaiFENesin (MUCINEX) 600 MG 12 hr tablet Take 600 mg by mouth daily as needed. For cough   Yes Historical Provider, MD  levothyroxine (SYNTHROID, LEVOTHROID) 75 MCG tablet Take 75 mcg by mouth daily.   Yes Historical Provider, MD  loratadine (CLARITIN) 10 MG tablet Take 10 mg by mouth daily.   Yes Historical Provider, MD  metoprolol tartrate (LOPRESSOR) 25 MG tablet Take 25 mg by mouth 2 (two) times daily.   Yes Historical Provider, MD  Multiple Vitamin (MULTIVITAMIN WITH MINERALS) TABS Take 1 tablet by mouth daily.   Yes Historical Provider, MD  omeprazole (PRILOSEC) 20 MG capsule Take 40 mg by mouth daily.   Yes Historical Provider, MD  potassium chloride SA (K-DUR,KLOR-CON) 20 MEQ tablet Take 20 mEq by mouth 2 (two) times daily.   Yes Historical Provider, MD  raloxifene (EVISTA) 60 MG tablet Take 60 mg by mouth daily.   Yes Historical Provider, MD  Physical Exam: Filed Vitals:   01/14/12 1311  BP: 138/49  Pulse: 66  Temp: 100.4 F (38 C)  TempSrc: Oral  Resp: 24  Height: 4\' 10"  (1.473 m)  Weight: 65.318 kg (144 lb)  SpO2: 94%     General:  Elderly female, difficult historian- hard of hearing  Eyes: wnl  ENT: wnl  Neck: no JVD  Cardiovascular: rrr  Respiratory: B/L wheezing, tight  Abdomen: +BS, soft, NT  Skin: no rashes or lesions, no LE edema  Musculoskeletal: moves all 4 extremities equally  Psychiatric: no SI, no HI  Neurologic: no focal deficit  Labs on Admission:  Basic Metabolic Panel:  Lab 01/14/12 6578  NA 137  K 3.4*  CL 99  CO2 27  GLUCOSE 156*  BUN 15  CREATININE 0.77  CALCIUM 9.0    MG --  PHOS --   Liver Function Tests:  Lab 01/14/12 1316  AST 21  ALT 15  ALKPHOS 51  BILITOT 0.4  PROT 6.9  ALBUMIN 3.4*    Lab 01/14/12 1533  LIPASE 13  AMYLASE --   No results found for this basename: AMMONIA:5 in the last 168 hours CBC:  Lab 01/14/12 1316  WBC 7.3  NEUTROABS 6.2  HGB 12.0  HCT 35.9*  MCV 92.8  PLT 154   Cardiac Enzymes: No results found for this basename: CKTOTAL:5,CKMB:5,CKMBINDEX:5,TROPONINI:5 in the last 168 hours  BNP (last 3 results)  Basename 01/14/12 1540  PROBNP 2902.0*   CBG: No results found for this basename: GLUCAP:5 in the last 168 hours  Radiological Exams on Admission: Dg Chest 2 View  01/14/2012  *RADIOLOGY REPORT*  Clinical Data: Upper respiratory infection.  Vomiting.  Loss of consciousness.  CHEST - 2 VIEW  Comparison: 04/13/2009  Findings: There is mild chronic cardiomegaly.  Pulmonary vascularity is normal.  The patient has chronic interstitial disease with scarring in the left lung base which is stable.  No effusions.  Diffuse osteopenia with old compression fractures in the thoracic and lumbar spine, stable.  IMPRESSION: No acute abnormality.   Original Report Authenticated By: Francene Boyers, M.D.     EKG: Independently reviewed. LBBB  Assessment/Plan Active Problems:  HYPERTENSION  Cough  Hypokalemia  PNA (pneumonia)  CAD (coronary artery disease)  LBBB (left bundle branch block)   1. Cough- most likely related to recent upper airway infection treated as a PNA, has a history of chronic cough, patient is wheezing so will as albuterol inhalers 2. Recent PNA- treated, no sign of PNA on chest x ray 3. ?CHF- BNP elevated and cardiomegaly seen on chest x ray but no LE edema or fluid on chest x ray- check echo- history of CABG 4. Hypokalemia- replace 5. LBBB- old 6. HTN- stable    Code Status: full Family Communication: patient at bedside Disposition Plan: home when better  Time spent: > 70 min  Aislinn Feliz,  Delene Morais [facility] Pager [phone]  If 7PM-7AM, please contact night-coverage [email or web address] Password [email or web address] 01/14/2012, 6:00 PM

## 2012-01-14 NOTE — ED Notes (Signed)
Dr. Lockwood at the bedside. 

## 2012-01-14 NOTE — ED Notes (Signed)
Per pt's son pt had pneumonia recently and was on antibiotics. She has had a cough since, last night she went to the restroom to vomit after eating crab and lost consciousness. Pt has been c/o upper back, shoulder, and neck pain.

## 2012-01-14 NOTE — ED Notes (Signed)
Patient reports onset of cold sx last night and emesis.  Patient states she has had several passing out spells as well.  Patient noted to have a cough in triage.  Patient has had ongoing nausea.  Patient reports she woke up with headache.  Denies chest pain but states she felt like she has had gas.  Patient is alert and oriented.   She has had weakness today and periods of weakness.  She denies sob at present

## 2012-01-14 NOTE — Progress Notes (Signed)
Pt arrived to floor from ED A&0 x3.  Will continue to monitor.  Amanda Pea, Charity fundraiser.

## 2012-01-14 NOTE — ED Notes (Signed)
Pt c/o tingling in her toes.

## 2012-01-14 NOTE — ED Notes (Signed)
Dr.Lockwood at bedside  

## 2012-01-14 NOTE — ED Notes (Signed)
MD at bedside. 

## 2012-01-14 NOTE — Progress Notes (Signed)
Pt's HR down to 30's and asymptomatic.  On arrival HR 52,BP159/68.  Dr. Benjamine Mola notified and stated she is aware and  has been running i 30's in ED and to monitor pt.  Will continue to monitor.  Amanda Pea, Charity fundraiser.

## 2012-01-15 ENCOUNTER — Encounter (HOSPITAL_COMMUNITY): Payer: Self-pay | Admitting: General Practice

## 2012-01-15 ENCOUNTER — Inpatient Hospital Stay (HOSPITAL_COMMUNITY): Payer: Medicare Other

## 2012-01-15 ENCOUNTER — Telehealth: Payer: Self-pay

## 2012-01-15 DIAGNOSIS — I509 Heart failure, unspecified: Secondary | ICD-10-CM

## 2012-01-15 LAB — BASIC METABOLIC PANEL
BUN: 14 mg/dL (ref 6–23)
CO2: 27 mEq/L (ref 19–32)
Chloride: 100 mEq/L (ref 96–112)
GFR calc non Af Amer: 64 mL/min — ABNORMAL LOW (ref >90)
Glucose, Bld: 87 mg/dL (ref 70–99)
Potassium: 3.8 mEq/L (ref 3.5–5.1)
Sodium: 137 mEq/L (ref 135–145)

## 2012-01-15 LAB — TSH: TSH: 0.468 u[IU]/mL (ref 0.350–4.500)

## 2012-01-15 LAB — CBC
HCT: 35.3 % — ABNORMAL LOW (ref 36.0–46.0)
Hemoglobin: 11.9 g/dL — ABNORMAL LOW (ref 12.0–15.0)
MCH: 31.2 pg (ref 26.0–34.0)
MCHC: 33.7 g/dL (ref 30.0–36.0)
RBC: 3.82 MIL/uL — ABNORMAL LOW (ref 3.87–5.11)

## 2012-01-15 MED ORDER — SODIUM CHLORIDE 0.9 % IJ SOLN: 3.0000 mL | INTRAMUSCULAR | Status: DC | PRN

## 2012-01-15 MED ORDER — SODIUM CHLORIDE 0.9 % IJ SOLN
3.0000 mL | Freq: Two times a day (BID) | INTRAMUSCULAR | Status: DC
  Administered 2012-01-15 – 2012-01-17 (×2): 3 mL via INTRAVENOUS

## 2012-01-15 MED ORDER — FAMOTIDINE 20 MG PO TABS
20.0000 mg | ORAL_TABLET | Freq: Two times a day (BID) | ORAL | Status: DC
  Administered 2012-01-15 – 2012-01-18 (×7): 20 mg via ORAL
  Filled 2012-01-15 (×9): qty 1

## 2012-01-15 MED ORDER — ACETAMINOPHEN 325 MG PO TABS: 650.0000 mg | ORAL_TABLET | Freq: Four times a day (QID) | ORAL | Status: DC | PRN

## 2012-01-15 NOTE — Telephone Encounter (Signed)
Tina Good states that Dr. Patsy Lager sent a letter to pts cardiologist and Tina Good would like a copy of the letter faxed to him at [phone]. Tina Good can be reached at [phone] or [phone]

## 2012-01-15 NOTE — Evaluation (Signed)
Physical Therapy Evaluation Patient Details Name: Tina Good MRN: [id number] DOB: [birth year] Today's Date: 01/15/2012 Time: 9528-4132 PT Time Calculation (min): 31 min  PT Assessment / Plan / Recommendation Clinical Impression  Pt admitted with cough, pt with h/o cough but increased difficulty with cough with upper respiratory infection and vomiting. Pt very pleasant and talkative and difficult to redirect at times. Pt lives with spouse and helps him with ADLs, performs all housework including loading wood stove in basement and per son she and spouse will need help when discharged however, son plans to have pt drive home to [facility] if possible. Son would like to arrange [facility] for dad as well as therapy for pt at discharge. Will plan to follow acutely to maximize balance and safety to increase independence prior to discharge.     PT Assessment  Patient needs continued PT services    Follow Up Recommendations  Home health PT          Barriers to Discharge Decreased caregiver support      Equipment Recommendations  None recommended by PT    Recommendations for Other Services     Frequency Min 3X/week    Precautions / Restrictions Precautions Precautions: Fall   Pertinent Vitals/Pain No pain sats 94-95% throughout on RA HR 74-76      Mobility  Bed Mobility Bed Mobility: Supine to Sit Supine to Sit: 6: Modified independent (Device/Increase time);HOB flat;With rails Transfers Transfers: Sit to Stand;Stand to Sit Sit to Stand: 6: Modified independent (Device/Increase time);From bed;From chair/3-in-1 Stand to Sit: 6: Modified independent (Device/Increase time);To chair/3-in-1 Ambulation/Gait Ambulation/Gait Assistance: 4: Min assist Ambulation Distance (Feet): 300 Feet Assistive device: 1 person hand held assist Ambulation/Gait Assistance Details: Pt stating that she doesn't have to hold onto anything at home but pt automatically reaching out for therapist or son's  hand with ambulation and without AD with increased BOS and decreased gait Gait Pattern: Step-through pattern;Decreased stride length;Wide base of support Gait velocity: decreased Stairs: Yes Stairs Assistance: 6: Modified independent (Device/Increase time) Stair Management Technique: One rail Right Number of Stairs: 8               PT Diagnosis: Difficulty walking  PT Problem List: Decreased balance;Decreased activity tolerance PT Treatment Interventions: Gait training;Balance training;Functional mobility training;Therapeutic activities;Patient/family education   PT Goals Acute Rehab PT Goals PT Goal Formulation: With patient Time For Goal Achievement: 01/22/12 Potential to Achieve Goals: Good Pt will Ambulate: >150 feet;with modified independence PT Goal: Ambulate - Progress: Goal set today Additional Goals Additional Goal #1: Pt will score > or equal to 46 on Berg to decrease fall risk. PT Goal: Additional Goal #1 - Progress: Goal set today  Visit Information  Last PT Received On: 01/15/12 Assistance Needed: +1    Subjective Data  Subjective: Oh my son and my nephew are seven weeks apart. My son has a hair salon here. Patient Stated Goal: return home with spouse   Prior Functioning  Home Living Lives With: Spouse Available Help at Discharge: Family;Available PRN/intermittently Type of Home: House Home Access: Level entry Home Layout: Two level;Able to live on main level with bedroom/bathroom Alternate Level Stairs-Number of Steps: basement is wood furnace but can use gas heat without need to go to basement Alternate Level Stairs-Rails: Left Bathroom Shower/Tub: Tub/shower unit;Curtain Bathroom Toilet: Handicapped height Home Adaptive Equipment: Shower chair with back;Walker - rolling Prior Function Level of Independence: Independent Able to Take Stairs?: Yes Driving: Yes Vocation: Retired Musician: H[employer] Dominant Hand:  Right    Cognition   Overall Cognitive Status: Appears within functional limits for tasks assessed/performed Arousal/Alertness: Awake/alert Orientation Level: Appears intact for tasks assessed Behavior During Session: [facility] for tasks performed    Extremity/Trunk Assessment Right Upper Extremity Assessment RUE ROM/Strength/Tone: [facility] for tasks assessed Left Upper Extremity Assessment LUE ROM/Strength/Tone: WFL for tasks assessed Right Lower Extremity Assessment RLE ROM/Strength/Tone: Within functional levels RLE Sensation: WFL - Light Touch Left Lower Extremity Assessment LLE ROM/Strength/Tone: Within functional levels LLE Sensation: WFL - Light Touch Trunk Assessment Trunk Assessment: Normal   Balance Static Standing Balance Static Standing - Balance Support: Left upper extremity supported Static Standing - Level of Assistance: 6: Modified independent (Device/Increase time) Static Standing - Comment/# of Minutes: 3 Single Leg Stance - Right Leg: 2  Single Leg Stance - Left Leg: 2  Tandem Stance - Right Leg: 10  Tandem Stance - Left Leg: 5  Rhomberg - Eyes Opened: 20  High Level Balance High Level Balance Comments: Pt able to turn 360degrees in 4 sec each direction, and able to perform standing balance activities but required assist to position leg for sharpened romberg and single limb stance, unable to complete Berg due to pt fatigue  End of Session PT - End of Session Activity Tolerance: Patient tolerated treatment well Patient left: in chair;with call bell/phone within reach;with family/visitor present  GP     Toney Sang Beth 01/15/2012, 4:35 PM  Delaney Meigs, PT [phone]

## 2012-01-15 NOTE — Progress Notes (Signed)
Pt made aware the advance directive document needed.  Stated " I have copy at home my son will bring it in."  [place], Charity fundraiser.

## 2012-01-15 NOTE — Progress Notes (Signed)
Pt informed of advanced directives & stated she has it at home and will ask her son  To bring it in.  Amanda Pea, Charity fundraiser.

## 2012-01-15 NOTE — Progress Notes (Signed)
PROGRESS NOTE  Tina Good BMW:[id number] DOB: [birth year] DOA: 01/14/2012 PCP: No primary provider on file. Cardiologist is Cammie Mcgee, [place]  Brief narrative: 78 yr old female admitted with possible PNA, in a setting of recently Rx PNa 12/27/11  Past medical history-As per Problem list Chart reviewed as below-  Nothing significant noted  Consultants:  none  Procedures:  Echo pending  Antibiotics:  None currently   Subjective  States she feels somewhat better than yesterday.  Still coughing up some phlegm.  This seems to be yellowish  No Cp, Hasn't had flu shot this year.  No sick contacts that she can clearly remember NO CP at present. Thinks she ate some crab that might not have agreed with her  On 12/28-then started feeling sicker Felt sicker and weaker on 12.29 Recently travellled to [place] and then visited [facility]    Objective    Interim History: Noted heart rates in the 30s  Telemetry: Sinus bradycardia heart rates in the 30's at times. Now sinus rhythm  Objective: Filed Vitals:   01/14/12 1819 01/14/12 2210 01/15/12 0522 01/15/12 1047  BP:  130/55 136/58 139/61  Pulse:  60 62 79  Temp: 98.6 F (37 C) 98 F (36.7 C) 100 F (37.8 C)   TempSrc: Oral Oral Oral   Resp:  18 18   Height:      Weight:   63.549 kg (140 lb 1.6 oz)   SpO2:  95% 95%     Intake/Output Summary (Last 24 hours) at 01/15/12 1058 Last data filed at 01/15/12 0945  Gross per 24 hour  Intake    580 ml  Output    851 ml  Net   -271 ml    Exam:  General: Alert pleasant oriented female in no apparent distress Cardiovascular: S1-S2 no murmur rub or gallop Respiratory: Clinically clear no added sound Abdomen: Soft nontender Skin no lower extremity edema Neuro is intact  Data Reviewed: Basic Metabolic Panel:  Lab 01/15/12 2725 01/14/12 1316  NA 137 137  K 3.8 3.4*  CL 100 99  CO2 27 27  GLUCOSE 87 156*  BUN 14 15  CREATININE 0.81 0.77  CALCIUM 8.6 9.0  MG --  --  PHOS -- --   Liver Function Tests:  Lab 01/14/12 1316  AST 21  ALT 15  ALKPHOS 51  BILITOT 0.4  PROT 6.9  ALBUMIN 3.4*    Lab 01/14/12 1533  LIPASE 13  AMYLASE --   No results found for this basename: AMMONIA:5 in the last 168 hours CBC:  Lab 01/15/12 0500 01/14/12 1316  WBC 5.8 7.3  NEUTROABS -- 6.2  HGB 11.9* 12.0  HCT 35.3* 35.9*  MCV 92.4 92.8  PLT 136* 154   Cardiac Enzymes: No results found for this basename: CKTOTAL:5,CKMB:5,CKMBINDEX:5,TROPONINI:5 in the last 168 hours BNP: No components found with this basename: POCBNP:5 CBG: No results found for this basename: GLUCAP:5 in the last 168 hours  No results found for this or any previous visit (from the past 240 hour(s)).   Studies:              All Imaging reviewed and is as per above notation   Scheduled Meds:   . albuterol  2.5 mg Nebulization Q6H  . aspirin  81 mg Oral Daily  . atorvastatin  20 mg Oral Daily  . docusate sodium  100 mg Oral 3 times weekly  . enoxaparin (LOVENOX) injection  40 mg Subcutaneous Q24H  . furosemide  40  mg Oral Daily  . levothyroxine  75 mcg Oral QAC breakfast  . metoprolol tartrate  25 mg Oral BID  . multivitamin with minerals  1 tablet Oral Daily  . pantoprazole  40 mg Oral Daily  . potassium chloride SA  20 mEq Oral BID  . raloxifene  60 mg Oral Daily  . sodium chloride  3 mL Intravenous Q12H   Continuous Infusions:    Assessment/Plan: 1. Cough + dyspnea-potentially CHF, as BNP 2902- 2-D echocardiogram is pending. Given Lasix iv 80.  Continue by mouth Lasix 40 daily, continue metoprolol 25 mg by mouth twice a day. Strict ins and outs, daily weights 2. Hypothyroid-continue Synthroid 75 mcg daily 3. Hyperlipidemia-continue Lipitor 20 mg daily 4. Upper airway cough syndrome?-Per Dr. Thurston Hole last note, will need to have max dose GERD and H1 blocker therapy-and tinea guaifenesin 600 when necessary, Tessalon 100 mg 3 times a day, Claritin 10 mg daily on  hold. 5. Hypothyroid-levothyroxine 75 mcg to continue 6. Piost-menopausal Osteoporosis-On Evista 60  Code Status: Full Family Communication: Spoke with son Jennette Kettle, who understands course of care Disposition Plan: in-patient   Pleas Koch, MD  [facility] Pager [phone] 01/15/2012, 10:58 AM    LOS: 1 day

## 2012-01-15 NOTE — Progress Notes (Signed)
  Echocardiogram 2D Echocardiogram has been performed.  Tina Good A 01/15/2012, 3:26 PM

## 2012-01-16 MED ORDER — FUROSEMIDE 10 MG/ML IJ SOLN
60.0000 mg | Freq: Once | INTRAMUSCULAR | Status: AC
  Administered 2012-01-16: 60 mg via INTRAVENOUS

## 2012-01-16 MED ORDER — FLUTICASONE PROPIONATE 50 MCG/ACT NA SUSP
2.0000 | Freq: Every day | NASAL | Status: DC
  Administered 2012-01-16 – 2012-01-18 (×3): 2 via NASAL
  Filled 2012-01-16: qty 16

## 2012-01-16 MED ORDER — GUAIFENESIN ER 600 MG PO TB12
1200.0000 mg | ORAL_TABLET | Freq: Every day | ORAL | Status: DC | PRN
  Filled 2012-01-16: qty 2

## 2012-01-16 MED ORDER — FUROSEMIDE 40 MG PO TABS
40.0000 mg | ORAL_TABLET | Freq: Two times a day (BID) | ORAL | Status: DC
  Administered 2012-01-17: 40 mg via ORAL
  Filled 2012-01-16 (×3): qty 1

## 2012-01-16 MED ORDER — ACETAMINOPHEN 325 MG PO TABS
650.0000 mg | ORAL_TABLET | Freq: Four times a day (QID) | ORAL | Status: DC
  Administered 2012-01-16 – 2012-01-18 (×8): 650 mg via ORAL
  Filled 2012-01-16 (×8): qty 2

## 2012-01-16 MED ORDER — FUROSEMIDE 40 MG PO TABS: 60.0000 mg | ORAL_TABLET | Freq: Every day | ORAL | Status: DC

## 2012-01-16 MED ORDER — DEXTROMETHORPHAN POLISTIREX 30 MG/5ML PO LQCR
30.0000 mg | Freq: Two times a day (BID) | ORAL | Status: DC
  Administered 2012-01-16 – 2012-01-18 (×5): 30 mg via ORAL
  Filled 2012-01-16 (×7): qty 5

## 2012-01-16 MED ORDER — ALBUTEROL SULFATE (5 MG/ML) 0.5% IN NEBU
2.5000 mg | INHALATION_SOLUTION | RESPIRATORY_TRACT | Status: DC
  Administered 2012-01-16 – 2012-01-18 (×10): 2.5 mg via RESPIRATORY_TRACT
  Filled 2012-01-16 (×11): qty 0.5

## 2012-01-16 NOTE — Progress Notes (Signed)
UR Chart Review Completed  

## 2012-01-16 NOTE — Progress Notes (Signed)
Physical Therapy Treatment Patient Details Name: Tina Good MRN: [id number] DOB: [birth year] Today's Date: 01/16/2012 Time: 4098-1191 PT Time Calculation (min): 24 min  PT Assessment / Plan / Recommendation Comments on Treatment Session  Patient very sweet and motivated. Was able to perform BERG. Patient has high level balance deficits and is at significant risk for falls based on score of BERG. Patient is planning on stayin with her son the first couple of nights after DC    Follow Up Recommendations  Home health PT     Does the patient have the potential to tolerate intense rehabilitation     Barriers to Discharge        Equipment Recommendations  None recommended by PT    Recommendations for Other Services    Frequency Min 3X/week   Plan Discharge plan remains appropriate;Frequency remains appropriate    Precautions / Restrictions Precautions Precautions: Fall Restrictions Weight Bearing Restrictions: No   Pertinent Vitals/Pain     Mobility  Bed Mobility Supine to Sit: 6: Modified independent (Device/Increase time) Transfers Sit to Stand: 6: Modified independent (Device/Increase time) Stand to Sit: 6: Modified independent (Device/Increase time) Ambulation/Gait Ambulation/Gait Assistance: 4: Min guard Ambulation Distance (Feet): 300 Feet Assistive device: None Ambulation/Gait Assistance Details: Patient with no reaching out this session. Good cadence and BOS Gait Pattern: Step-through pattern;Decreased stride length    Exercises     PT Diagnosis:    PT Problem List:   PT Treatment Interventions:     PT Goals Acute Rehab PT Goals PT Goal: Ambulate - Progress: Progressing toward goal Additional Goals PT Goal: Additional Goal #1 - Progress: Progressing toward goal  Visit Information  Last PT Received On: 01/16/12    Subjective Data      Cognition  Overall Cognitive Status: Appears within functional limits for tasks  assessed/performed Arousal/Alertness: Awake/alert Orientation Level: Appears intact for tasks assessed Behavior During Session: [facility] for tasks performed    Balance  Standardized Balance Assessment Standardized Balance Assessment: Berg Balance Test Berg Balance Test Sit to Stand: Able to stand without using hands and stabilize independently Standing Unsupported: Able to stand safely 2 minutes Sitting with Back Unsupported but Feet Supported on Floor or Stool: Able to sit safely and securely 2 minutes Stand to Sit: Sits safely with minimal use of hands Transfers: Able to transfer safely, minor use of hands Standing Unsupported with Eyes Closed: Able to stand 10 seconds with supervision Standing Ubsupported with Feet Together: Able to place feet together independently and stand 1 minute safely From Standing, Reach Forward with Outstretched Arm: Can reach forward >12 cm safely (5") From Standing Position, Pick up Object from Floor: Unable to try/needs assist to keep balance (Patient leans on object for support) From Standing Position, Turn to Look Behind Over each Shoulder: Looks behind from both sides and weight shifts well Turn 360 Degrees: Able to turn 360 degrees safely in 4 seconds or less Standing Unsupported, Alternately Place Feet on Step/Stool: Able to stand independently and complete 8 steps >20 seconds Standing Unsupported, One Foot in Front: Able to take small step independently and hold 30 seconds Standing on One Leg: Tries to lift leg/unable to hold 3 seconds but remains standing independently Total Score: 44   End of Session PT - End of Session Equipment Utilized During Treatment: Gait belt Activity Tolerance: Patient tolerated treatment well Patient left: in bed;with call bell/phone within reach Nurse Communication: Mobility status   GP     Fredrich Birks 01/16/2012, 3:07 PM  01/16/2012 Fredrich Birks PTA [phone] pager [phone] office

## 2012-01-16 NOTE — Telephone Encounter (Signed)
Jennette Kettle is patients son, I have looked and do not have a letter in system, is it okay to send him a copy of the last office visit?

## 2012-01-16 NOTE — Care Management Note (Signed)
    Page 1 of 1   01/16/2012     4:11:24 PM   CARE MANAGEMENT NOTE 01/16/2012  Patient:  Tina Good, Tina Good   Account Number:  [id number]  Date Initiated:  01/16/2012  Documentation initiated by:  Tera Mater  Subjective/Objective Assessment:   78yo female admitted with CHF.  Pt. lives with her spouse, in [place], [place].     Action/Plan:   In to speak with pt. about [facility] services, and at this time the pt. does not want Home Health services.  She would rather follow up with her PCP and Cardiologist in [place].   Anticipated DC Date:  01/17/2012   Anticipated DC Plan:  HOME/SELF CARE      DC Planning Services  CM consult      Choice offered to / List presented to:             Status of service:  In process, will continue to follow Medicare Important Message given?   (If response is "NO", the following Medicare IM given date fields will be blank) Date Medicare IM given:   Date Additional Medicare IM given:    Discharge Disposition:    Per UR Regulation:  Reviewed for med. necessity/level of care/duration of stay  If discussed at Long Length of Stay Meetings, dates discussed:    Comments:  01/16/12 1530 With pt.'s permission, this NCM called her PCP, Dr. Yaakov Guthrie, and made a follow up appointment for January 24, 2012 at 2:30pm, and Dr. Cammie Mcgee, pt. Cardiologist, for January 22, 2012 at 12:00pm.  Made pt. aware of follow up appointments and entered information onto discharge summary (AVS).  In addition, with pt. permission, faxed history and physical, labwork, and xray results to Dr. Laurena Slimmer office [phone]).  Pt. may be dc home tomorrow. Tera Mater, RN, BSN NCM [phone]

## 2012-01-16 NOTE — Progress Notes (Signed)
PROGRESS NOTE  Ananya Mccleese ZOX:[id number] DOB: [birth year] DOA: 01/14/2012 PCP: No primary provider on file. Cardiologist is Cammie Mcgee, [place]  Brief narrative: 78 yr old female admitted with possible cough and shortness of breath in a setting of recently Rx PNa 12/27/11-she was worked up and it was found that she did not have repeat pneumonia on chest x-ray however she had low-grade chills and fever. During admission  Past medical history-As per Problem list Chart reviewed as below-  Nothing significant noted  Consultants:  none  Procedures:  Chest Xray 12/29 = no acute ab normality  Chest x-ray repeated 12/30=  Echo performed 01/15/2012 = grade 1 diastolic section EF 55-60% poor study-systolic pressures moderately increased with PA peak of 50 mm   Antibiotics:  None currently   Subjective  States she feels somewhat better but has headache.  Is still coughing States some sputum , and felt chilly.   Feels aggravated but the cough seems to have "broken" No SOB at present    Objective    Interim History: Discuss therapy recommends home health aide and home health the  Telemetry: . Now sinus rhythm-nonsustained bradycardia with sleeping 12/30  Objective: Filed Vitals:   01/15/12 1811 01/15/12 2041 01/16/12 0439 01/16/12 1016  BP: 149/56 156/55 174/61 146/63  Pulse: 75 82 79 71  Temp: 99.5 F (37.5 C) 99.2 F (37.3 C) 100.9 F (38.3 C) 98.6 F (37 C)  TempSrc: Oral Oral Oral Oral  Resp: 18 18 20 18   Height:      Weight:   63.277 kg (139 lb 8 oz)   SpO2: 94% 95% 94% 91%    Intake/Output Summary (Last 24 hours) at 01/16/12 1035 Last data filed at 01/16/12 1020  Gross per 24 hour  Intake    923 ml  Output    850 ml  Net     73 ml    Exam:  General: Alert pleasant oriented female in no apparent distress-no sinus tenderness or pressure Cardiovascular: S1-S2 no murmur rub or gallop Respiratory: Wheeze heard more on left lower lung fields lower  basis Abdomen: Soft nontender Skin no lower extremity edema Neuro is intact  Data Reviewed: Basic Metabolic Panel:  Lab 01/15/12 8119 01/14/12 1316  NA 137 137  K 3.8 3.4*  CL 100 99  CO2 27 27  GLUCOSE 87 156*  BUN 14 15  CREATININE 0.81 0.77  CALCIUM 8.6 9.0  MG -- --  PHOS -- --   Liver Function Tests:  Lab 01/14/12 1316  AST 21  ALT 15  ALKPHOS 51  BILITOT 0.4  PROT 6.9  ALBUMIN 3.4*    Lab 01/14/12 1533  LIPASE 13  AMYLASE --   No results found for this basename: AMMONIA:5 in the last 168 hours CBC:  Lab 01/15/12 0500 01/14/12 1316  WBC 5.8 7.3  NEUTROABS -- 6.2  HGB 11.9* 12.0  HCT 35.3* 35.9*  MCV 92.4 92.8  PLT 136* 154   Cardiac Enzymes: No results found for this basename: CKTOTAL:5,CKMB:5,CKMBINDEX:5,TROPONINI:5 in the last 168 hours BNP: No components found with this basename: POCBNP:5 CBG: No results found for this basename: GLUCAP:5 in the last 168 hours  No results found for this or any previous visit (from the past 240 hour(s)).   Studies:              All Imaging reviewed and is as per above notation   Scheduled Meds:    . albuterol  2.5 mg Nebulization Q6H  .  aspirin  81 mg Oral Daily  . atorvastatin  20 mg Oral Daily  . docusate sodium  100 mg Oral 3 times weekly  . enoxaparin (LOVENOX) injection  40 mg Subcutaneous Q24H  . famotidine  20 mg Oral BID  . furosemide  40 mg Oral Daily  . levothyroxine  75 mcg Oral QAC breakfast  . metoprolol tartrate  25 mg Oral BID  . multivitamin with minerals  1 tablet Oral Daily  . pantoprazole  40 mg Oral Daily  . potassium chloride SA  20 mEq Oral BID  . raloxifene  60 mg Oral Daily  . sodium chloride  3 mL Intravenous Q12H  . sodium chloride  3 mL Intravenous Q12H   Continuous Infusions:    Assessment/Plan: 1. Cough + dyspnea potentially CHF, as BNP 2902- Given Lasix iv 80.  Given another dose of IV Lasix 12/31-will start on by mouth Lasix 40 twice a day on 01/17/12-if good output  and symptomatic resolution, probably can discharge home 2. Likely upper respiratory infection, viral-patient recently treated for pneumonia however no systemic evidences of infection. Mild temperature 100.9 12/31-will reassess in a.m. Have added Delsym 30g twice a day, fluticasone 50 mcg twice a day-continue Tessalon 100 3 times a day, continue Mucinex 1200 daily by mouth 3. Hypothyroid-continue Synthroid 75 mcg daily 4. Hyperlipidemia-continue Lipitor 20 mg daily 5. Upper airway cough syndrome?-Per Dr. Thurston Hole last note, will need to have max dose GERD and H1 blocker therapy. Added Pepcid 2012/30 6. Hypothyroid-levothyroxine 75 mcg to continue 7. Constipation-continue Colace 100 3 times a day 8. Piost-menopausal Osteoporosis-On Evista 60  Code Status: Full Family Communication: Spoke with son Arlys John who understands course of care-I. have explained potential discharge tomorrow and they understand and Disposition Plan: in-patient   Pleas Koch, MD  [facility] Pager [phone] 01/16/2012, 10:35 AM    LOS: 2 days

## 2012-01-16 NOTE — Telephone Encounter (Signed)
Called and spoke with Tina Good is in the hospital with a CHF exacerbation.  I actually had sent [name] a copy of her CXR report to show to her cardiologist, not a letter.  Will fax a copy of CXR report to [facility]

## 2012-01-17 ENCOUNTER — Inpatient Hospital Stay (HOSPITAL_COMMUNITY): Payer: Medicare Other

## 2012-01-17 DIAGNOSIS — J479 Bronchiectasis, uncomplicated: Secondary | ICD-10-CM

## 2012-01-17 DIAGNOSIS — I4891 Unspecified atrial fibrillation: Secondary | ICD-10-CM

## 2012-01-17 DIAGNOSIS — I2510 Atherosclerotic heart disease of native coronary artery without angina pectoris: Secondary | ICD-10-CM

## 2012-01-17 LAB — COMPREHENSIVE METABOLIC PANEL
Albumin: 3.3 g/dL — ABNORMAL LOW (ref 3.5–5.2)
Alkaline Phosphatase: 51 U/L (ref 39–117)
BUN: 14 mg/dL (ref 6–23)
Chloride: 101 mEq/L (ref 96–112)
Creatinine, Ser: 0.77 mg/dL (ref 0.50–1.10)
GFR calc Af Amer: 86 mL/min — ABNORMAL LOW (ref >90)
Glucose, Bld: 103 mg/dL — ABNORMAL HIGH (ref 70–99)
Potassium: 4.8 mEq/L (ref 3.5–5.1)
Total Bilirubin: 0.5 mg/dL (ref 0.3–1.2)

## 2012-01-17 LAB — URINALYSIS, ROUTINE W REFLEX MICROSCOPIC
Ketones, ur: NEGATIVE mg/dL
Nitrite: NEGATIVE
Protein, ur: NEGATIVE mg/dL
pH: 8 (ref 5.0–8.0)

## 2012-01-17 LAB — CBC
HCT: 40.9 % (ref 36.0–46.0)
Hemoglobin: 13.6 g/dL (ref 12.0–15.0)
MCHC: 33.3 g/dL (ref 30.0–36.0)
MCV: 91.5 fL (ref 78.0–100.0)
RDW: 12.8 % (ref 11.5–15.5)

## 2012-01-17 LAB — INFLUENZA PANEL BY PCR (TYPE A & B)
Influenza A By PCR: POSITIVE — AB
Influenza B By PCR: NEGATIVE

## 2012-01-17 MED ORDER — OSELTAMIVIR PHOSPHATE 75 MG PO CAPS
75.0000 mg | ORAL_CAPSULE | Freq: Two times a day (BID) | ORAL | Status: DC
  Administered 2012-01-17 – 2012-01-18 (×2): 75 mg via ORAL
  Filled 2012-01-17 (×3): qty 1

## 2012-01-17 MED ORDER — FUROSEMIDE 40 MG PO TABS
40.0000 mg | ORAL_TABLET | Freq: Every day | ORAL | Status: DC
  Administered 2012-01-18: 40 mg via ORAL
  Filled 2012-01-17: qty 1

## 2012-01-17 MED ORDER — LEVOFLOXACIN 750 MG PO TABS
750.0000 mg | ORAL_TABLET | ORAL | Status: DC
  Administered 2012-01-17: 750 mg via ORAL
  Filled 2012-01-17 (×2): qty 1

## 2012-01-17 MED ORDER — LEVOFLOXACIN 500 MG PO TABS: 500.0000 mg | ORAL_TABLET | Freq: Every day | ORAL | Status: DC

## 2012-01-17 NOTE — Progress Notes (Signed)
[facility]                                                                                Patient Demographics  Tina Good, is a 79 y.o. female  ZOX:[id number]  WJX:[id number]  DOB - March 23, 1924  Admit date - 01/14/2012  Admitting Physician Joseph Art, DO  Outpatient Primary MD for the patient is No primary provider on file.  LOS - 3   Chief Complaint  Patient presents with  . URI  . Emesis  . Loss of Consciousness        Assessment & Plan    Brief narrative:   79 yr old female admitted with possible cough and shortness of breath in a setting of recently Rx PNa 12/27/11-she was worked up and it was found that she did not have repeat pneumonia on chest x-ray however she had low-grade chills and fever. During admission   1. Mild acute on chronic diastolic CHF, as BNP 2902- was initially treated with IV Lasix, we'll cut her down to once a day 40 mg oral Lasix and monitor.   2. Likely upper respiratory infection, ? viral-patient -  had fever again this morning, will check influenza panel, UA and chest x-ray are stable, question if she has mild acute bronchitis, will place her on empiric Levaquin, monitor blood cultures .   3. Hypothyroid-continue Synthroid 75 mcg daily.   4. Hyperlipidemia-continue Lipitor 20 mg daily.   5. Upper airway cough syndrome?-Per Dr. Thurston Hole last note, will need to have max dose GERD and H1 blocker therapy. Added Pepcid 2012/30.   6. Constipation-continue Colace 100 3 times a day.   7. Post-menopausal Osteoporosis-On Evista 60.   8. Old left bundle branch block on EKG, no chest pain, echo shows no wall motion abnormalities, outpatient followup with primary cardiologist. On low-dose aspirin on with beta blocker and statin.     Code Status: full  Family Communication: patient  Disposition Plan: home   Procedures Echo  - Left ventricle: The cavity size was normal. There was mild focal basal and moderate  concentric hypertrophy of the septum. Systolic function was normal. The estimated ejection fraction was in the range of 55% to 60%. Although no diagnostic regional wall motion abnormality was identified, this possibility cannot be completely excluded on the basis of this study. Doppler parameters are consistent with abnormal left ventricular relaxation (grade 1 diastolic dysfunction). Doppler parameters are consistent with high ventricular filling pressure. - Mitral valve: Moderately calcified annulus. Mildly thickened leaflets . Mild regurgitation. - Left atrium: The atrium was mildly dilated. - Pulmonary arteries: Systolic pressure was moderately increased. PA peak pressure: 50mm Hg (S).     Consults  none   DVT Prophylaxis  Lovenox  Lab Results  Component Value Date   PLT 153 01/17/2012    Medications  Scheduled Meds:   . acetaminophen  650 mg Oral Q6H  . albuterol  2.5 mg Nebulization Q4H  . aspirin  81 mg Oral Daily  . atorvastatin  20 mg Oral Daily  . dextromethorphan  30 mg Oral BID  . docusate sodium  100 mg Oral 3 times weekly  . enoxaparin (LOVENOX) injection  40 mg Subcutaneous Q24H  . famotidine  20 mg Oral BID  . fluticasone  2 spray Each Nare Daily  . furosemide  40 mg Oral BID  . levofloxacin  750 mg Oral Q48H  . levothyroxine  75 mcg Oral QAC breakfast  . metoprolol tartrate  25 mg Oral BID  . multivitamin with minerals  1 tablet Oral Daily  . pantoprazole  40 mg Oral Daily  . raloxifene  60 mg Oral Daily  . sodium chloride  3 mL Intravenous Q12H  . sodium chloride  3 mL Intravenous Q12H   Continuous Infusions:  PRN Meds:.sodium chloride, benzonatate, guaiFENesin, ondansetron (ZOFRAN) IV, ondansetron, sodium chloride  Antibiotics    Anti-infectives     Start     Dose/Rate Route Frequency Ordered Stop   01/17/12 1130   levofloxacin (LEVAQUIN) tablet 750 mg        750 mg Oral Every 48 hours 01/17/12 1042     01/17/12 0845   levofloxacin (LEVAQUIN) tablet  500 mg  Status:  Discontinued        500 mg Oral Daily 01/17/12 0844 01/17/12 1041           Time Spent in minutes   35   SINGH,PRASHANT K M.D on 01/17/2012 at 12:14 PM  Between 7am to 7pm - Pager - [phone]  After 7pm go to [email or web address] - password [email or web address]  And look for the night coverage person covering for me after hours  [facility] Office  [phone]    Subjective:   Tina Good today has, No headache, No chest pain, No abdominal pain - No Nausea, No new weakness tingling or numbness, Mild  Cough - No SOB.   Objective:   Filed Vitals:   01/17/12 0425 01/17/12 0507 01/17/12 0912 01/17/12 1032  BP:  157/65  125/60  Pulse: 72 73  72  Temp:  100.4 F (38 C)  99.2 F (37.3 C)  TempSrc:  Oral  Oral  Resp: 18 17  18   Height:      Weight:  62.596 kg (138 lb)    SpO2: 95% 92% 94% 93%    Wt Readings from Last 3 Encounters:  01/17/12 62.596 kg (138 lb)  12/27/11 66.497 kg (146 lb 9.6 oz)  04/13/09 68.04 kg (150 lb)     Intake/Output Summary (Last 24 hours) at 01/17/12 1214 Last data filed at 01/17/12 1158  Gross per 24 hour  Intake    603 ml  Output   1075 ml  Net   -472 ml    Exam Awake Alert, Oriented X 3, No new F.N deficits, Normal affect [place].AT,PERRAL Supple Neck,No JVD, No cervical lymphadenopathy appriciated.  Symmetrical Chest wall movement, Good air movement bilaterally, CTAB RRR,No Gallops,Rubs or new Murmurs, No Parasternal Heave +ve B.Sounds, Abd Soft, Non tender, No organomegaly appriciated, No rebound - guarding or rigidity. No Cyanosis, Clubbing or edema, No new Rash or bruise      Data Review   Micro Results No results found for this or any previous visit (from the past 240 hour(s)).  Radiology Reports Dg Chest 2 View  01/15/2012  *RADIOLOGY REPORT*  Clinical Data: Pneumonia, cough  CHEST - 2 VIEW  Comparison: Chest x-ray dated yesterday, 01/14/2012  Findings: No significant interval change in the appearance of the  lungs over the last 24 hours.  Persistent cardiomegaly with left atrial enlargement this patient status post median sternotomy with evidence of prior multivessel CABG. Calcification of the mitral valve annulus.  Tortuous and atherosclerotic thoracic aorta. Persistent bronchiectasis and bibasilar and left lateral interstitial fibrotic changes dating back to March 2011.  No new focal airspace consolidation, pulmonary edema or other acute body identified.  Stable thoracic and thoracolumbar compression fractures with associated exaggerated thoracic kyphosis again noted.  The bones appear diffusely osteopenic.  IMPRESSION:  1.  No acute cardiopulmonary disease and no significant interval change over the last 24 hours. 2.  Stable mid thoracic and thoracolumbar compression fractures with exaggerated thoracic kyphosis 3.  Stable cardiomegaly and prominent calcification of the mitral valve annulus with left atrial enlargement.   Original Report Authenticated By: Malachy Moan, M.D.    Dg Chest 2 View  01/14/2012  *RADIOLOGY REPORT*  Clinical Data: Upper respiratory infection.  Vomiting.  Loss of consciousness.  CHEST - 2 VIEW  Comparison: 04/13/2009  Findings: There is mild chronic cardiomegaly.  Pulmonary vascularity is normal.  The patient has chronic interstitial disease with scarring in the left lung base which is stable.  No effusions.  Diffuse osteopenia with old compression fractures in the thoracic and lumbar spine, stable.  IMPRESSION: No acute abnormality.   Original Report Authenticated By: Francene Boyers, M.D.    Dg Chest 2 View  12/27/2011  *RADIOLOGY REPORT*  Clinical Data: Cough and shortness of breath.  CHEST - 2 VIEW  Comparison: No priors.  Findings: Mild elevation of the left hemidiaphragm.  Slight prominence of interstitial markings in the lung bases, likely reflective of areas of mild scarring and/or subsegmental atelectasis.   Mild bronchial wall thickening predominately in the right lower  lobe with some interstitial prominence, which could suggest a very mild bronchopneumonia.  Lungs are otherwise clear. No pleural effusions.  No evidence of pulmonary edema.  Heart size is borderline enlarged.  Mediastinal contours are unremarkable. Severe mitral annular calcifications.  Atherosclerosis in the thoracic aorta.  Status post median sternotomy for CABG.  IMPRESSION: 1.  Findings concerning for early bronchopneumonia in the right lower lobe. 2.  Borderline cardiomegaly. 3.  Atherosclerosis. 4.  Severe mitral annular calcifications.   Original Report Authenticated By: Trudie Reed, M.D.    Dg Chest Port 1 View  01/17/2012  *RADIOLOGY REPORT*  Clinical Data: Fever, nausea, pneumonia.  History of hypertension, CHF, CAD, MI, CABG.  PORTABLE CHEST - 1 VIEW  Comparison: 01/15/2012  Findings: The patient has had median sternotomy with CABG.  Heart is enlarged.  There is prominence of interstitial markings, slightly increased over prior studies.  Findings raise the question of interstitial edema versus shallow lung inflation.  There are no focal consolidations or definite pleural effusions.  There is dense atherosclerotic calcification of the thoracic aorta.  IMPRESSION:  1.  Cardiomegaly and increased prominence of interstitial markings. 2.  Question of interstitial edema.   Original Report Authenticated By: Norva Pavlov, M.D.     CBC  Lab 01/17/12 0500 01/15/12 0500 01/14/12 1316  WBC 5.9 5.8 7.3  HGB 13.6 11.9* 12.0  HCT 40.9 35.3* 35.9*  PLT 153 136* 154  MCV 91.5 92.4 92.8  MCH 30.4 31.2 31.0  MCHC 33.3 33.7 33.4  RDW 12.8 12.9 12.8  LYMPHSABS -- -- 0.6*  MONOABS -- -- 0.5  EOSABS -- -- 0.0  BASOSABS -- -- 0.0  BANDABS -- -- --    Chemistries   Lab 01/17/12 0500 01/15/12 0500 01/14/12 1316  NA 139 137 137  K 4.8 3.8 3.4*  CL 101 100 99  CO2 28 27 27   GLUCOSE 103* 87 156*  BUN  14 14 15   CREATININE 0.77 0.81 0.77  CALCIUM 9.2 8.6 9.0  MG -- -- --  AST 37 -- 21  ALT 19  -- 15  ALKPHOS 51 -- 51  BILITOT 0.5 -- 0.4   ------------------------------------------------------------------------------------------------------------------ estimated creatinine clearance is 40.3 ml/min (by C-G formula based on Cr of 0.77). ------------------------------------------------------------------------------------------------------------------ No results found for this basename: HGBA1C:2 in the last 72 hours ------------------------------------------------------------------------------------------------------------------ No results found for this basename: CHOL:2,HDL:2,LDLCALC:2,TRIG:2,CHOLHDL:2,LDLDIRECT:2 in the last 72 hours ------------------------------------------------------------------------------------------------------------------  [facility] 01/14/12 2057  TSH 0.468  T4TOTAL --  T3FREE --  THYROIDAB --   ------------------------------------------------------------------------------------------------------------------ No results found for this basename: VITAMINB12:2,FOLATE:2,FERRITIN:2,TIBC:2,IRON:2,RETICCTPCT:2 in the last 72 hours  Coagulation profile No results found for this basename: INR:5,PROTIME:5 in the last 168 hours  No results found for this basename: DDIMER:2 in the last 72 hours  Cardiac Enzymes No results found for this basename: CK:3,CKMB:3,TROPONINI:3,MYOGLOBIN:3 in the last 168 hours ------------------------------------------------------------------------------------------------------------------ No components found with this basename: POCBNP:3

## 2012-01-17 NOTE — Progress Notes (Signed)
Patient's influenza results are positive, MD notified and order given; will continue to monitor patient.  Lorretta Harp RN

## 2012-01-17 NOTE — Progress Notes (Signed)
ANTIBIOTIC CONSULT NOTE - INITIAL  Pharmacy Consult for Levaquin Indication: URI  No Known Allergies  Patient Measurements: Height: 4\' 10"  (147.3 cm) Weight: 138 lb (62.596 kg) (b scale) IBW/kg (Calculated) : 40.9   Vital Signs: Temp: 99.2 F (37.3 C) (01/01 1032) Temp src: Oral (01/01 1032) BP: 125/60 mmHg (01/01 1032) Pulse Rate: 72  (01/01 1032) Intake/Output from previous day: 12/31 0701 - 01/01 0700 In: 603 [P.O.:600; I.V.:3] Out: 1075 [Urine:1075] Intake/Output from this shift: Total I/O In: 240 [P.O.:240] Out: -   Labs:  Basename 01/17/12 0500 01/15/12 0500 01/14/12 1316  WBC 5.9 5.8 7.3  HGB 13.6 11.9* 12.0  PLT 153 136* 154  LABCREA -- -- --  CREATININE 0.77 0.81 0.77   Estimated Creatinine Clearance: 40.3 ml/min (by C-G formula based on Cr of 0.77). No results found for this basename: VANCOTROUGH:2,VANCOPEAK:2,VANCORANDOM:2,GENTTROUGH:2,GENTPEAK:2,GENTRANDOM:2,TOBRATROUGH:2,TOBRAPEAK:2,TOBRARND:2,AMIKACINPEAK:2,AMIKACINTROU:2,AMIKACIN:2, in the last 72 hours   Microbiology: No results found for this or any previous visit (from the past 720 hour(s)).  Medical History: Past Medical History  Diagnosis Date  . Hypertension   . Hyperlipidemia   . Pneumonia   . CHF (congestive heart failure)   . Arthritis   . Coronary artery disease     2007  . Myocardial infarction   . Shortness of breath   . GERD (gastroesophageal reflux disease)     Assessment: 79 yo F w/ cough and SOB with recent Hx of PNa 12/11 which does not appear to have recurrent PNa by CXR. Pt has been febrile but with normal WBC. CrCl is ~40. Pt has multiple risk factors for cardiac complications due to QT prolongation.   Plan:  - Leavquin 750mg  daily  -  Watch closely for Levaquin induced AMS given advanced age -  Avoid other QT prolonging agents and keep length of therapy to a minimum.   Vania Rea. Darin Engels.D. Clinical Pharmacist Pager [phone] Phone [phone] 01/17/2012 11:03  AM

## 2012-01-18 DIAGNOSIS — R079 Chest pain, unspecified: Secondary | ICD-10-CM

## 2012-01-18 MED ORDER — OSELTAMIVIR PHOSPHATE 75 MG PO CAPS: 75.0000 mg | ORAL_CAPSULE | Freq: Two times a day (BID) | ORAL | Status: DC

## 2012-01-18 MED ORDER — ALBUTEROL SULFATE HFA 108 (90 BASE) MCG/ACT IN AERS: 2.0000 | INHALATION_SPRAY | Freq: Four times a day (QID) | RESPIRATORY_TRACT | Status: AC | PRN

## 2012-01-18 MED ORDER — ALBUTEROL SULFATE (5 MG/ML) 0.5% IN NEBU: 2.5000 mg | INHALATION_SOLUTION | Freq: Two times a day (BID) | RESPIRATORY_TRACT | Status: DC

## 2012-01-18 NOTE — Progress Notes (Signed)
Mid level on call MD was paged and notified because clarification was needed about patient's telemetry status. Mid level MD called and said to place patient on telemetry until further clarified by physician in the am. Patient is on telemetry.

## 2012-01-18 NOTE — Discharge Summary (Addendum)
[facility]                                                                                   Tina Good, is a 79 y.o. female  DOB 1925/01/09  MRN [id number].  Admission date:  01/14/2012  Discharge Date:  01/18/2012  Primary MD  No primary provider on file.  Admitting Physician  Tina Art, DO  Admission Diagnosis  Cough [786.2] Hypokalemia [276.8] LBBB (left bundle branch block) [426.3] Syncope [780.2] Acute exacerbation of congestive heart failure [428.0] Acute congestive heart failure  Discharge Diagnosis     Active Problems:  HYPERTENSION  Cough  Hypokalemia  PNA (pneumonia)  CAD (coronary artery disease)  LBBB (left bundle branch block)    Past Medical History  Diagnosis Date  . Hypertension   . Hyperlipidemia   . Pneumonia   . CHF (congestive heart failure)   . Arthritis   . Coronary artery disease     2007  . Myocardial infarction   . Shortness of breath   . GERD (gastroesophageal reflux disease)     Past Surgical History  Procedure Date  . Abdominal hysterectomy   . Cardiac bypass   . Coronary artery bypass graft   . Coronary angioplasty with stent placement   . Eye surgery     cataracts  . Back surgery       Discharge Diagnoses:   Active Problems:  HYPERTENSION  Cough  Hypokalemia  PNA (pneumonia)  CAD (coronary artery disease)  LBBB (left bundle branch block)    Discharge Condition:     Diet recommendation: See Discharge Instructions below   Consults None   History of present illness and  Hospital Course:  See H&P, Labs, Consult and Test reports for all details in brief, patient was admitted for cough fevers and shortness of breath due to influenza infection, and tested positive for H1N1 by PCR, she was started on Tamiflu with good results, she is currently feeling back to baseline eager to go home and off of oxygen.    She has chronic diastolic CHF for which her home dose Lasix will be continued,  she also has underlying history of hypothyroidism, dyslipidemia and osteoporosis for which her Synthroid, Lipitor and Evista 60 mg daily will be continued. Of note patient has old history of left bundle branch block which is stable and history of CAD no acute issues this admission.   She had incidental finding of mild pulmonary hypertension for which she will follow with her primary care physician as outpatient.    Today   Subjective:   Tina Good today has no headache,no chest abdominal pain,no new weakness tingling or numbness, feels much better wants to go home today.    Objective:   Blood pressure 144/67, pulse 77, temperature 97.5 F (36.4 C), temperature source Oral, resp. rate 20, height 4\' 10"  (1.473 m), weight 63.141 kg (139 lb 3.2 oz), SpO2 98.00%.   Intake/Output Summary (Last 24 hours) at 01/18/12 1139 Last data filed at 01/18/12 0915  Gross per 24 hour  Intake   1069 ml  Output   1250 ml  Net   -181 ml  Exam Awake Alert, Oriented *3, No new F.N deficits, Normal affect [place].AT,PERRAL Supple Neck,No JVD, No cervical lymphadenopathy appriciated.  Symmetrical Chest wall movement, Good air movement bilaterally, CTAB RRR,No Gallops,Rubs or new Murmurs, No Parasternal Heave +ve B.Sounds, Abd Soft, Non tender, No organomegaly appriciated, No rebound -guarding or rigidity. No Cyanosis, Clubbing or edema, No new Rash or bruise  Data Review   Major procedures and Radiology Reports - PLEASE review detailed and final reports for all details in brief -    Echo  - Left ventricle: The cavity size was normal. There was mild focal basal and moderate concentric hypertrophy of the septum. Systolic function was normal. The estimated ejection fraction was in the range of 55% to 60%. Although no diagnostic regional wall motion abnormality was identified, this possibility cannot be completely excluded on the basis of this study. Doppler parameters are consistent with abnormal left  ventricular relaxation (grade 1 diastolic dysfunction). Doppler parameters are consistent with high ventricular filling pressure. - Mitral valve: Moderately calcified annulus. Mildly thickened leaflets . Mild regurgitation. - Left atrium: The atrium was mildly dilated. - Pulmonary arteries: Systolic pressure was moderately increased. PA peak pressure: 50mm Hg (S).     Dg Chest 2 View  01/15/2012  *RADIOLOGY REPORT*  Clinical Data: Pneumonia, cough  CHEST - 2 VIEW  Comparison: Chest x-ray dated yesterday, 01/14/2012  Findings: No significant interval change in the appearance of the lungs over the last 24 hours.  Persistent cardiomegaly with left atrial enlargement this patient status post median sternotomy with evidence of prior multivessel CABG. Calcification of the mitral valve annulus.  Tortuous and atherosclerotic thoracic aorta. Persistent bronchiectasis and bibasilar and left lateral interstitial fibrotic changes dating back to March 2011.  No new focal airspace consolidation, pulmonary edema or other acute body identified.  Stable thoracic and thoracolumbar compression fractures with associated exaggerated thoracic kyphosis again noted.  The bones appear diffusely osteopenic.  IMPRESSION:  1.  No acute cardiopulmonary disease and no significant interval change over the last 24 hours. 2.  Stable mid thoracic and thoracolumbar compression fractures with exaggerated thoracic kyphosis 3.  Stable cardiomegaly and prominent calcification of the mitral valve annulus with left atrial enlargement.   Original Report Authenticated By: Malachy Moan, M.D.    Dg Chest 2 View  01/14/2012  *RADIOLOGY REPORT*  Clinical Data: Upper respiratory infection.  Vomiting.  Loss of consciousness.  CHEST - 2 VIEW  Comparison: 04/13/2009  Findings: There is mild chronic cardiomegaly.  Pulmonary vascularity is normal.  The patient has chronic interstitial disease with scarring in the left lung base which is stable.  No  effusions.  Diffuse osteopenia with old compression fractures in the thoracic and lumbar spine, stable.  IMPRESSION: No acute abnormality.   Original Report Authenticated By: Francene Boyers, M.D.    Dg Chest 2 View  12/27/2011  *RADIOLOGY REPORT*  Clinical Data: Cough and shortness of breath.  CHEST - 2 VIEW  Comparison: No priors.  Findings: Mild elevation of the left hemidiaphragm.  Slight prominence of interstitial markings in the lung bases, likely reflective of areas of mild scarring and/or subsegmental atelectasis.   Mild bronchial wall thickening predominately in the right lower lobe with some interstitial prominence, which could suggest a very mild bronchopneumonia.  Lungs are otherwise clear. No pleural effusions.  No evidence of pulmonary edema.  Heart size is borderline enlarged.  Mediastinal contours are unremarkable. Severe mitral annular calcifications.  Atherosclerosis in the thoracic aorta.  Status post median sternotomy for  CABG.  IMPRESSION: 1.  Findings concerning for early bronchopneumonia in the right lower lobe. 2.  Borderline cardiomegaly. 3.  Atherosclerosis. 4.  Severe mitral annular calcifications.   Original Report Authenticated By: Trudie Reed, M.D.    Dg Chest Port 1 View  01/17/2012  *RADIOLOGY REPORT*  Clinical Data: Fever, nausea, pneumonia.  History of hypertension, CHF, CAD, MI, CABG.  PORTABLE CHEST - 1 VIEW  Comparison: 01/15/2012  Findings: The patient has had median sternotomy with CABG.  Heart is enlarged.  There is prominence of interstitial markings, slightly increased over prior studies.  Findings raise the question of interstitial edema versus shallow lung inflation.  There are no focal consolidations or definite pleural effusions.  There is dense atherosclerotic calcification of the thoracic aorta.  IMPRESSION:  1.  Cardiomegaly and increased prominence of interstitial markings. 2.  Question of interstitial edema.   Original Report Authenticated By: Norva Pavlov, M.D.     Micro Results      Recent Results (from the past 240 hour(s))  CULTURE, BLOOD (ROUTINE X 2)     Status: Normal (Preliminary result)   Collection Time   01/17/12  9:10 AM      Component Value Range Status Comment   Specimen Description BLOOD LEFT ANTECUBITAL   Final    Special Requests BOTTLES DRAWN AEROBIC ONLY 4CC   Final    Culture  Setup Time 01/17/2012 16:35   Final    Culture     Final    Value:        BLOOD CULTURE RECEIVED NO GROWTH TO DATE CULTURE WILL BE HELD FOR 5 DAYS BEFORE ISSUING A FINAL NEGATIVE REPORT   Report Status PENDING   Incomplete   CULTURE, BLOOD (ROUTINE X 2)     Status: Normal (Preliminary result)   Collection Time   01/17/12  9:25 AM      Component Value Range Status Comment   Specimen Description BLOOD HAND LEFT   Final    Special Requests BOTTLES DRAWN AEROBIC ONLY 4CC   Final    Culture  Setup Time 01/17/2012 16:35   Final    Culture     Final    Value:        BLOOD CULTURE RECEIVED NO GROWTH TO DATE CULTURE WILL BE HELD FOR 5 DAYS BEFORE ISSUING A FINAL NEGATIVE REPORT   Report Status PENDING   Incomplete      CBC w Diff: Lab Results  Component Value Date   WBC 5.9 01/17/2012   WBC 11.6* 12/27/2011   HGB 13.6 01/17/2012   HGB 12.9 12/27/2011   HCT 40.9 01/17/2012   HCT 41.7 12/27/2011   PLT 153 01/17/2012   LYMPHOPCT 8* 01/14/2012   MONOPCT 6 01/14/2012   EOSPCT 0 01/14/2012   BASOPCT 0 01/14/2012    CMP: Lab Results  Component Value Date   NA 139 01/17/2012   K 4.8 01/17/2012   CL 101 01/17/2012   CO2 28 01/17/2012   BUN 14 01/17/2012   CREATININE 0.77 01/17/2012   PROT 7.6 01/17/2012   ALBUMIN 3.3* 01/17/2012   BILITOT 0.5 01/17/2012   ALKPHOS 51 01/17/2012   AST 37 01/17/2012   ALT 19 01/17/2012  .   Discharge Instructions     Follow with Primary MD No primary provider on file. in 7 days   Get CBC, CMP, checked 7 days by Primary MD and again as instructed by your Primary MD. Get a 2 view Chest X ray done  next visit.  Get Medicines  reviewed and adjusted.  Please request your Prim.MD to go over all Hospital Tests and Procedure/Radiological results at the follow up, please get all Hospital records sent to your Prim MD by signing hospital release before you go home.  Activity: As tolerated with Full fall precautions use walker/cane & assistance as needed   Diet:  Heart Healthy  For Heart failure patients - Check your Weight same time everyday, if you gain over 2 pounds, or you develop in leg swelling, experience more shortness of breath or chest pain, call your Primary MD immediately. Follow Cardiac Low Salt Diet and 1.8 lit/day fluid restriction.  Disposition Home   If you experience worsening of your admission symptoms, develop shortness of breath, life threatening emergency, suicidal or homicidal thoughts you must seek medical attention immediately by calling 911 or calling your MD immediately  if symptoms less severe.  You Must read complete instructions/literature along with all the possible adverse reactions/side effects for all the Medicines you take and that have been prescribed to you. Take any new Medicines after you have completely understood and accpet all the possible adverse reactions/side effects.   Do not drive and provide baby sitting services if your were admitted for syncope or siezures until you have seen by Primary MD or a Neurologist and advised to do so again.  Do not drive when taking Pain medications.    Do not take more than prescribed Pain, Sleep and Anxiety Medications  Special Instructions: If you have smoked or chewed Tobacco  in the last 2 yrs please stop smoking, stop any regular Alcohol  and or any Recreational drug use.  Wear Seat belts while driving.  Follow-up Information    Follow up with Dr. Yaakov Guthrie. On 01/24/2012. (January 24, 2012 at 2:30pm)    Contact information:   [phone]      Follow up with Dr. Cammie Mcgee. On 01/22/2012. (January 22, 2012 at 12:00pm)     Contact information:   [phone]      Schedule an appointment as soon as possible for a visit in 1 week to follow up.           Discharge Medications     Medication List     As of 01/18/2012 11:39 AM    START taking these medications         albuterol 108 (90 BASE) MCG/ACT inhaler   Commonly known as: PROVENTIL HFA;VENTOLIN HFA   Inhale 2 puffs into the lungs every 6 (six) hours as needed for wheezing.      oseltamivir 75 MG capsule   Commonly known as: TAMIFLU   Take 1 capsule (75 mg total) by mouth 2 (two) times daily.      CONTINUE taking these medications         aspirin 81 MG tablet      atorvastatin 20 MG tablet   Commonly known as: LIPITOR      benzonatate 100 MG capsule   Commonly known as: TESSALON      calcium carbonate 750 MG chewable tablet   Commonly known as: TUMS EX      docusate sodium 100 MG capsule   Commonly known as: COLACE      furosemide 40 MG tablet   Commonly known as: LASIX      guaiFENesin 600 MG 12 hr tablet   Commonly known as: MUCINEX      levothyroxine 75 MCG tablet   Commonly known as: SYNTHROID, LEVOTHROID  loratadine 10 MG tablet   Commonly known as: CLARITIN      metoprolol tartrate 25 MG tablet   Commonly known as: LOPRESSOR      multivitamin with minerals Tabs      omeprazole 20 MG capsule   Commonly known as: PRILOSEC      potassium chloride SA 20 MEQ tablet   Commonly known as: K-DUR,KLOR-CON      raloxifene 60 MG tablet   Commonly known as: EVISTA      VITAMIN D-3 PO      STOP taking these medications         cefdinir 300 MG capsule   Commonly known as: OMNICEF          Where to get your medications    These are the prescriptions that you need to pick up. We sent them to a specific pharmacy, so you will need to go there to get them.   [facility] [place], [place] - [address]    [address] [place] [address]    Phone: [phone]        albuterol 108 (90 BASE) MCG/ACT  inhaler   oseltamivir 75 MG capsule               Total Time in preparing paper work, data evaluation and todays exam - 35 minutes  Leroy Sea M.D on 01/18/2012 at 11:39 AM  [facility] Office  [phone]

## 2012-01-18 NOTE — Progress Notes (Signed)
Patient's IV and telemetry has been discontinued, patient and her son verbalizes understanding of discharge instructions and the patient is being discharged home with family.  Lorretta Harp RN

## 2012-01-18 NOTE — Progress Notes (Signed)
PT Cancellation Note  Patient Details Name: Tina Good MRN: [id number] DOB: [birth year]   Cancelled Treatment:    Reason Eval/Treat Not Completed: Other (comment) (To discharge today.  Discussed safety with mobility at home.)  Also spoke with son regarding safety issues to prevent falls at home.   WYNN,CYNDI 01/18/2012, 4:33 PM

## 2012-01-19 LAB — URINE CULTURE

## 2012-01-23 LAB — CULTURE, BLOOD (ROUTINE X 2): Culture: NO GROWTH

## 2013-08-08 ENCOUNTER — Encounter: Payer: Self-pay | Admitting: Cardiology

## 2014-01-08 IMAGING — CR DG CHEST 2V
2 series · 2 of 2 positions shown · non-contrast
Comparison: No priors.

CLINICAL DATA: Cough and shortness of breath.

CHEST - 2 VIEW

[PA]
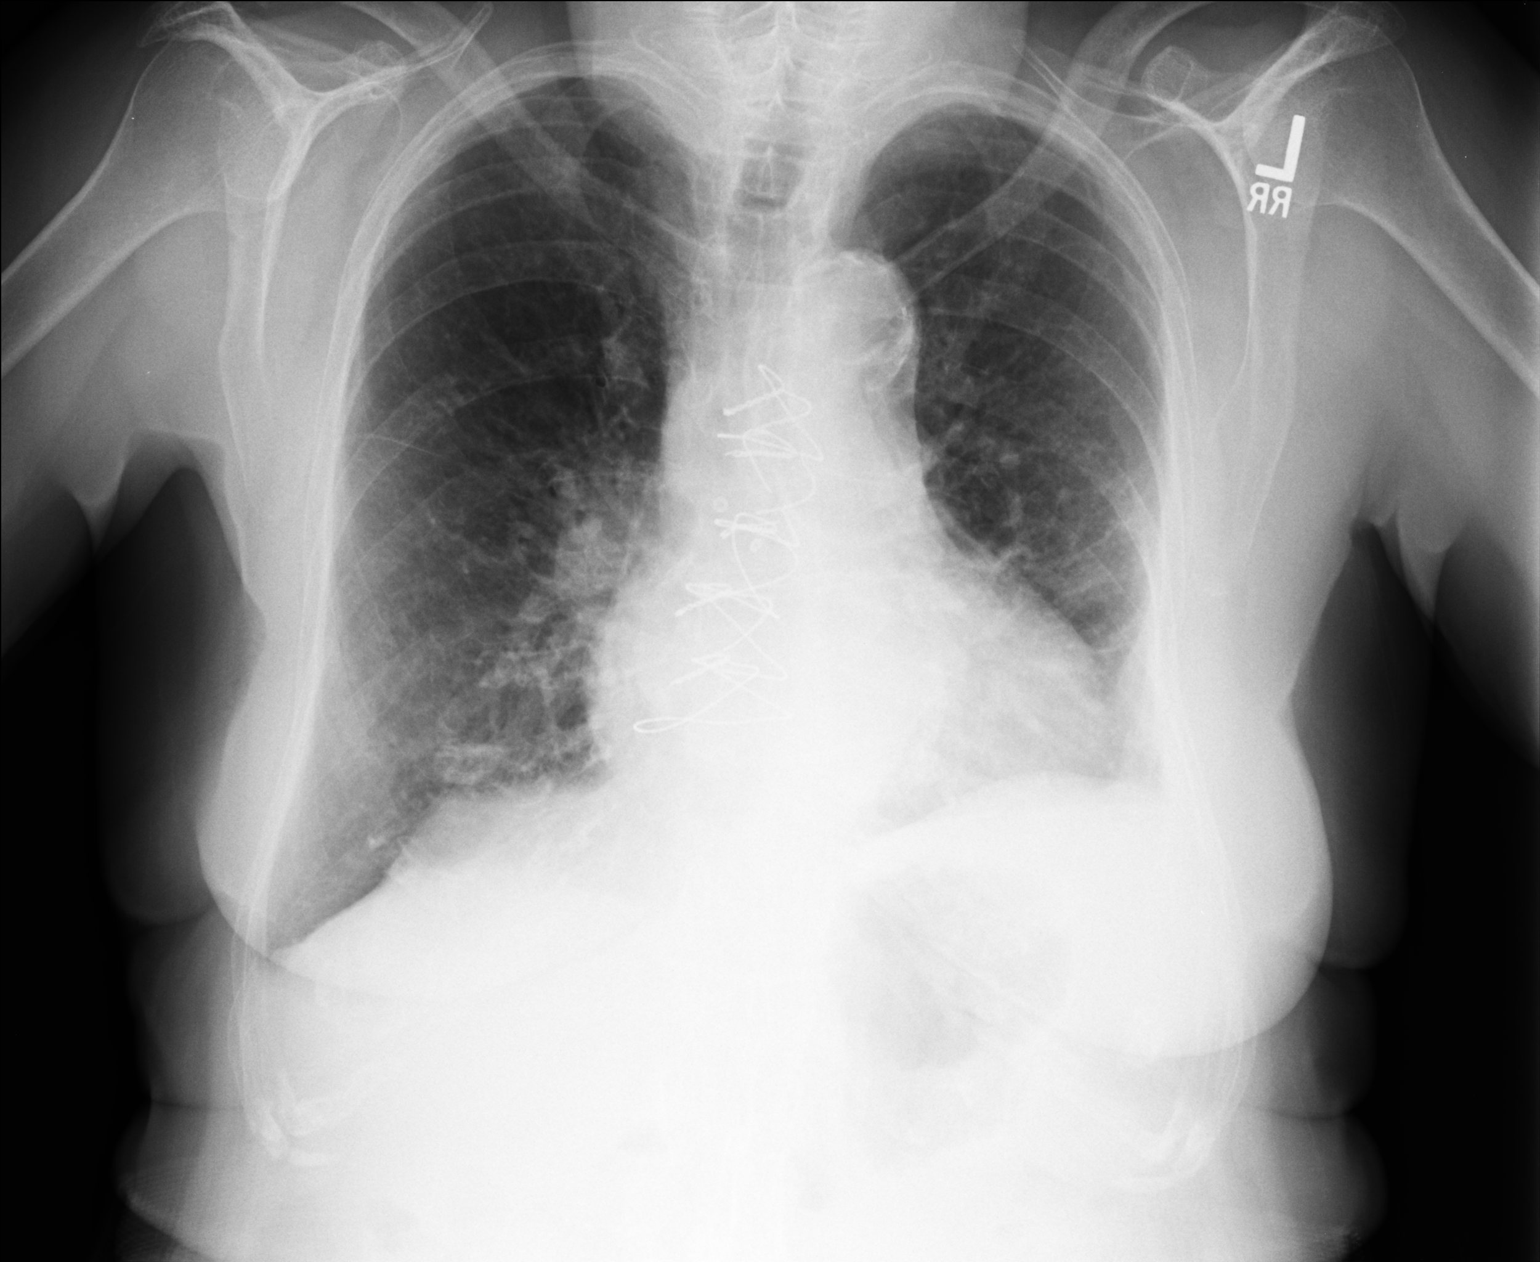

[lateral]
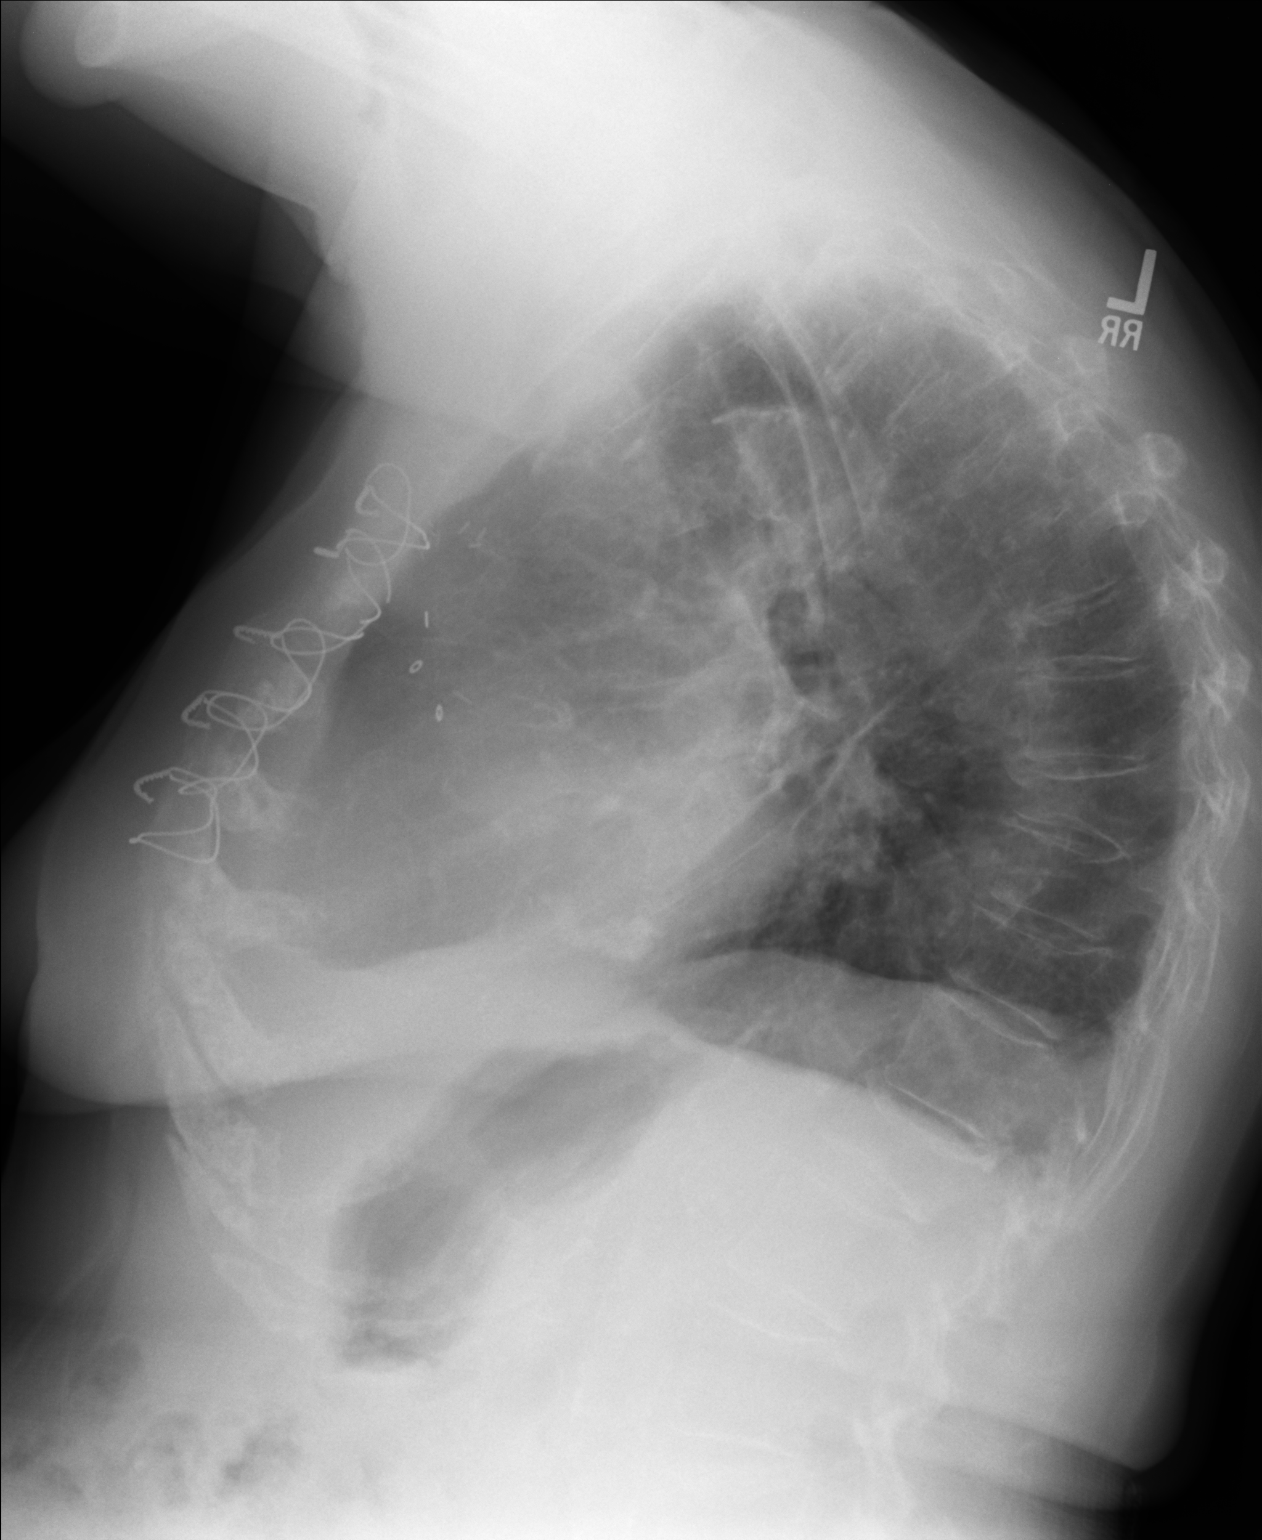

[2 of 2 positions shown; findings below may reference images not displayed]

FINDINGS: Mild elevation of the left hemidiaphragm.  Slight
prominence of interstitial markings in the lung bases, likely
reflective of areas of mild scarring and/or subsegmental
atelectasis.   Mild bronchial wall thickening predominately in the
right lower lobe with some interstitial prominence, which could
suggest a very mild bronchopneumonia.  Lungs are otherwise clear.
No pleural effusions.  No evidence of pulmonary edema.  Heart size
is borderline enlarged.  Mediastinal contours are unremarkable.
Severe mitral annular calcifications.  Atherosclerosis in the
thoracic aorta.  Status post median sternotomy for CABG.
IMPRESSION: 1.  Findings concerning for early bronchopneumonia in the right
lower lobe.
2.  Borderline cardiomegaly.
3.  Atherosclerosis.
4.  Severe mitral annular calcifications.

## 2014-01-26 IMAGING — CR DG CHEST 2V
2 series · 2 of 2 positions shown · non-contrast
Comparison: 04/13/2009

CLINICAL DATA: Upper respiratory infection.  Vomiting.  Loss of
consciousness.

CHEST - 2 VIEW

[w chest pa]
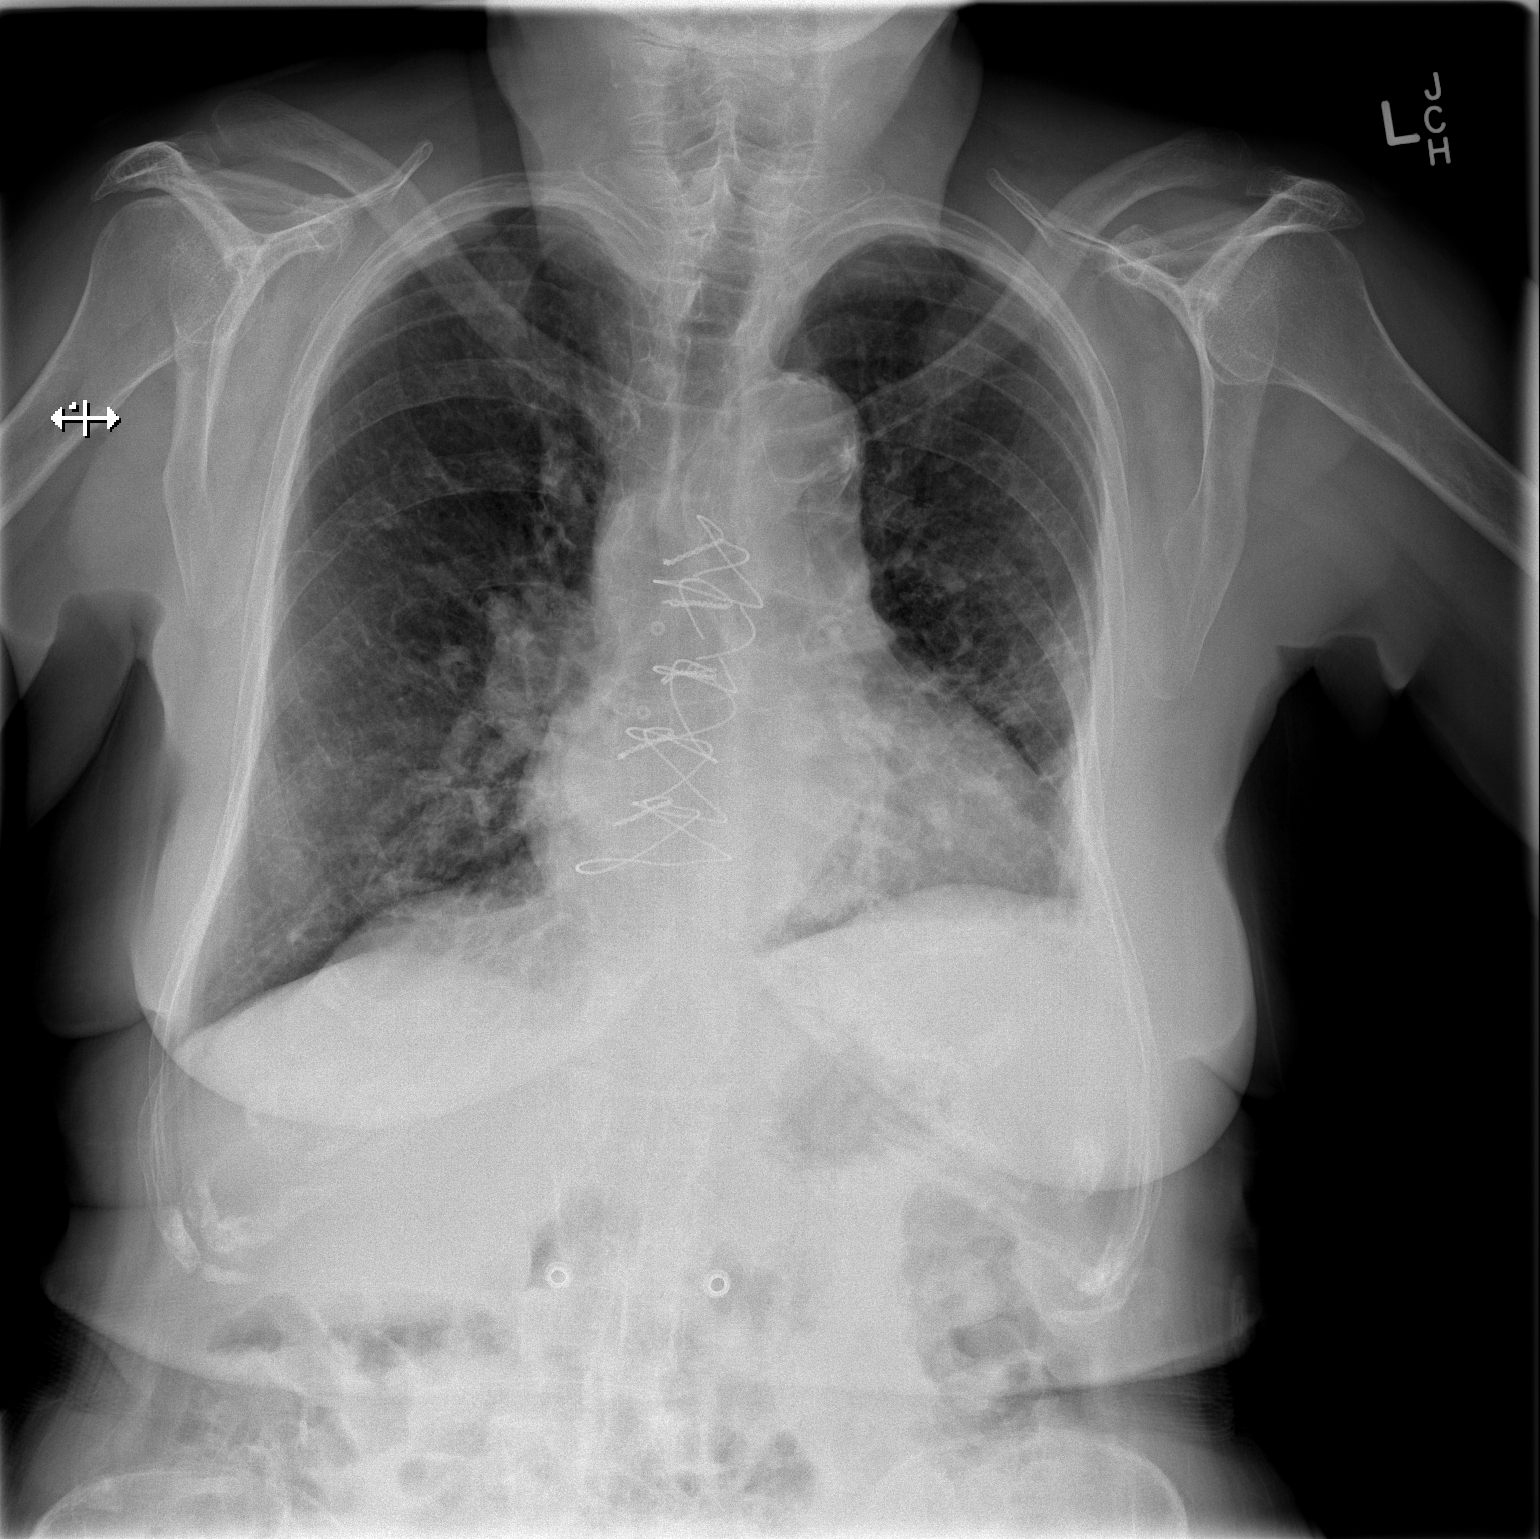

[w chest lat]
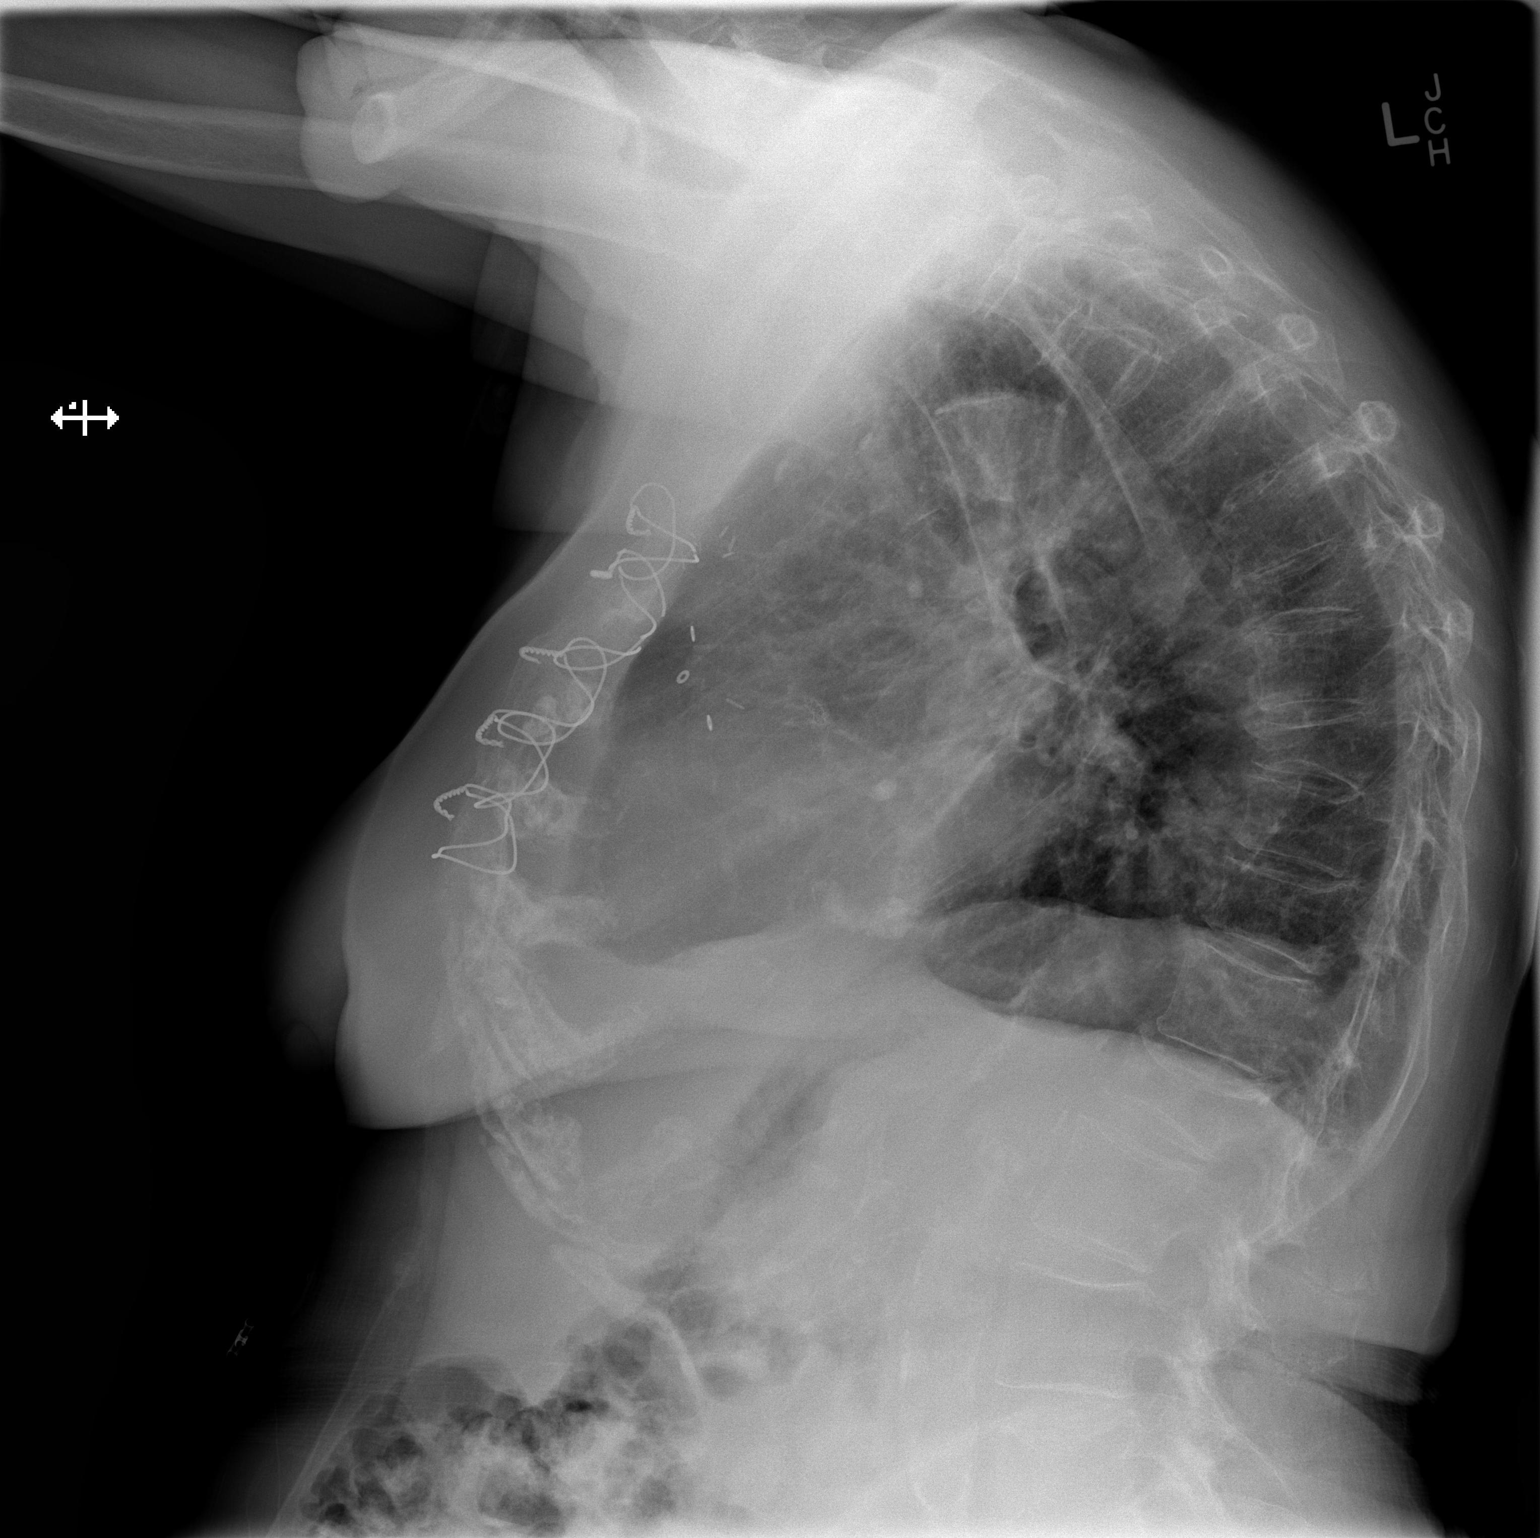

[2 of 2 positions shown; findings below may reference images not displayed]

FINDINGS: There is mild chronic cardiomegaly.  Pulmonary
vascularity is normal.  The patient has chronic interstitial
disease with scarring in the left lung base which is stable.  No
effusions.  Diffuse osteopenia with old compression fractures in
the thoracic and lumbar spine, stable.
IMPRESSION: No acute abnormality.

## 2014-01-29 IMAGING — CR DG CHEST 1V PORT
1 series · 1 of 1 positions shown · non-contrast
Comparison: 01/15/2012

CLINICAL DATA: Fever, nausea, pneumonia.  History of hypertension,
CHF, CAD, MI, CABG.

PORTABLE CHEST - 1 VIEW

[AP]
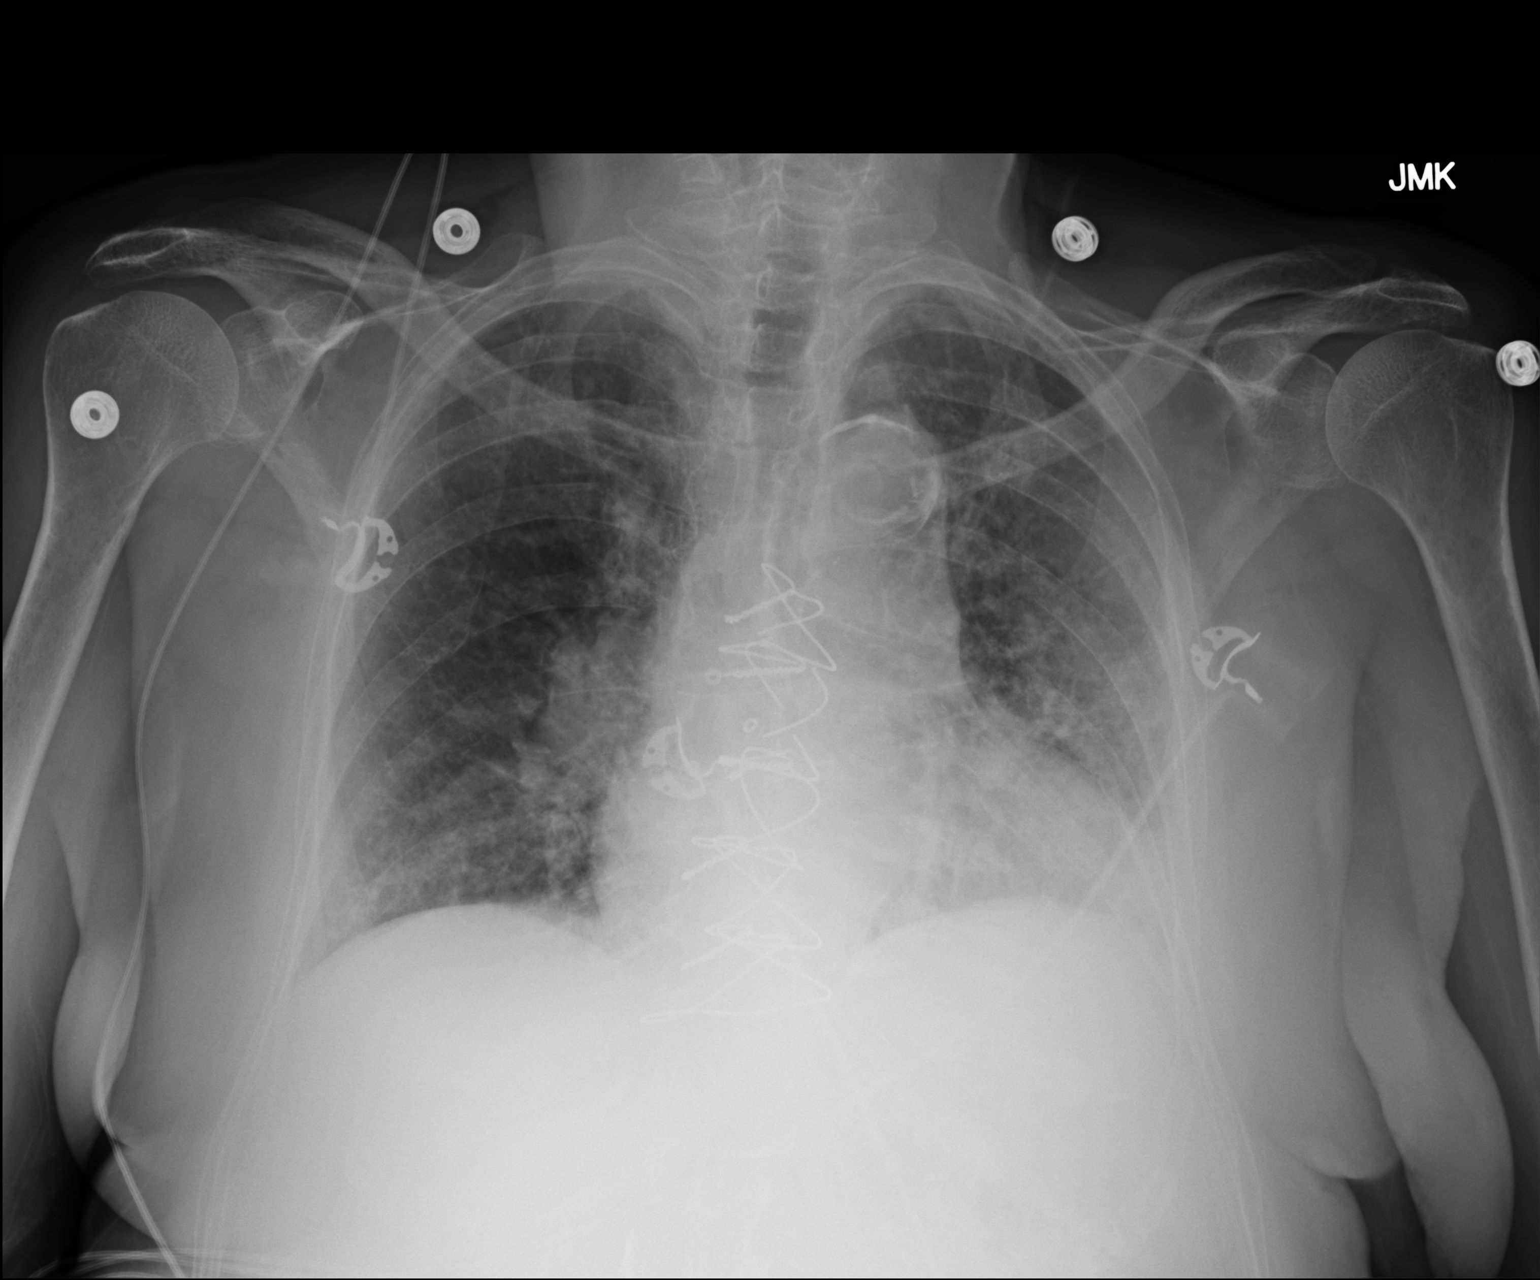

[1 of 1 positions shown; findings below may reference images not displayed]

FINDINGS: The patient has had median sternotomy with CABG.  Heart
is enlarged.  There is prominence of interstitial markings,
slightly increased over prior studies.  Findings raise the question
of interstitial edema versus shallow lung inflation.  There are no
focal consolidations or definite pleural effusions.  There is dense
atherosclerotic calcification of the thoracic aorta.
IMPRESSION: 1.  Cardiomegaly and increased prominence of interstitial markings.
2.  Question of interstitial edema.

## 2015-07-21 ENCOUNTER — Emergency Department (HOSPITAL_COMMUNITY): Payer: Medicare Other

## 2015-07-21 ENCOUNTER — Encounter (HOSPITAL_COMMUNITY): Payer: Self-pay

## 2015-07-21 ENCOUNTER — Observation Stay (HOSPITAL_COMMUNITY)
Admission: EM | Admit: 2015-07-21 | Discharge: 2015-07-23 | Disposition: A | Discharge status: Home / Self Care | Payer: Medicare Other | Attending: Internal Medicine | Admitting: Internal Medicine

## 2015-07-21 DIAGNOSIS — I4891 Unspecified atrial fibrillation: Secondary | ICD-10-CM

## 2015-07-21 DIAGNOSIS — I10 Essential (primary) hypertension: Secondary | ICD-10-CM | POA: Diagnosis present

## 2015-07-21 DIAGNOSIS — R001 Bradycardia, unspecified: Secondary | ICD-10-CM | POA: Diagnosis not present

## 2015-07-21 DIAGNOSIS — I272 Other secondary pulmonary hypertension: Secondary | ICD-10-CM | POA: Diagnosis not present

## 2015-07-21 DIAGNOSIS — J440 Chronic obstructive pulmonary disease with acute lower respiratory infection: Secondary | ICD-10-CM | POA: Diagnosis not present

## 2015-07-21 DIAGNOSIS — I2510 Atherosclerotic heart disease of native coronary artery without angina pectoris: Secondary | ICD-10-CM | POA: Diagnosis present

## 2015-07-21 DIAGNOSIS — I5022 Chronic systolic (congestive) heart failure: Secondary | ICD-10-CM | POA: Insufficient documentation

## 2015-07-21 DIAGNOSIS — I25119 Atherosclerotic heart disease of native coronary artery with unspecified angina pectoris: Secondary | ICD-10-CM | POA: Diagnosis not present

## 2015-07-21 DIAGNOSIS — I447 Left bundle-branch block, unspecified: Secondary | ICD-10-CM | POA: Insufficient documentation

## 2015-07-21 DIAGNOSIS — E039 Hypothyroidism, unspecified: Secondary | ICD-10-CM | POA: Diagnosis not present

## 2015-07-21 DIAGNOSIS — Z951 Presence of aortocoronary bypass graft: Secondary | ICD-10-CM | POA: Insufficient documentation

## 2015-07-21 DIAGNOSIS — E785 Hyperlipidemia, unspecified: Secondary | ICD-10-CM | POA: Insufficient documentation

## 2015-07-21 DIAGNOSIS — J849 Interstitial pulmonary disease, unspecified: Secondary | ICD-10-CM | POA: Insufficient documentation

## 2015-07-21 DIAGNOSIS — I350 Nonrheumatic aortic (valve) stenosis: Secondary | ICD-10-CM | POA: Diagnosis not present

## 2015-07-21 DIAGNOSIS — Z7982 Long term (current) use of aspirin: Secondary | ICD-10-CM | POA: Insufficient documentation

## 2015-07-21 DIAGNOSIS — I252 Old myocardial infarction: Secondary | ICD-10-CM | POA: Diagnosis not present

## 2015-07-21 DIAGNOSIS — R110 Nausea: Secondary | ICD-10-CM | POA: Diagnosis not present

## 2015-07-21 DIAGNOSIS — K219 Gastro-esophageal reflux disease without esophagitis: Secondary | ICD-10-CM | POA: Diagnosis not present

## 2015-07-21 DIAGNOSIS — R0600 Dyspnea, unspecified: Secondary | ICD-10-CM

## 2015-07-21 DIAGNOSIS — M1990 Unspecified osteoarthritis, unspecified site: Secondary | ICD-10-CM | POA: Insufficient documentation

## 2015-07-21 DIAGNOSIS — Z66 Do not resuscitate: Secondary | ICD-10-CM | POA: Insufficient documentation

## 2015-07-21 DIAGNOSIS — I482 Chronic atrial fibrillation: Principal | ICD-10-CM | POA: Insufficient documentation

## 2015-07-21 DIAGNOSIS — Z7722 Contact with and (suspected) exposure to environmental tobacco smoke (acute) (chronic): Secondary | ICD-10-CM | POA: Insufficient documentation

## 2015-07-21 DIAGNOSIS — I110 Hypertensive heart disease with heart failure: Secondary | ICD-10-CM | POA: Insufficient documentation

## 2015-07-21 LAB — URINALYSIS, ROUTINE W REFLEX MICROSCOPIC
Bilirubin Urine: NEGATIVE
Glucose, UA: NEGATIVE mg/dL
Hgb urine dipstick: NEGATIVE
KETONES UR: NEGATIVE mg/dL
NITRITE: NEGATIVE
PH: 7.5 (ref 5.0–8.0)
Protein, ur: NEGATIVE mg/dL
SPECIFIC GRAVITY, URINE: 1.011 (ref 1.005–1.030)

## 2015-07-21 LAB — TROPONIN I

## 2015-07-21 LAB — CBC
HCT: 39.1 % (ref 36.0–46.0)
Hemoglobin: 13 g/dL (ref 12.0–15.0)
MCH: 31.3 pg (ref 26.0–34.0)
MCHC: 33.2 g/dL (ref 30.0–36.0)
MCV: 94.2 fL (ref 78.0–100.0)
PLATELETS: 185 10*3/uL (ref 150–400)
RBC: 4.15 MIL/uL (ref 3.87–5.11)
RDW: 13.1 % (ref 11.5–15.5)
WBC: 7.7 10*3/uL (ref 4.0–10.5)

## 2015-07-21 LAB — HEPATIC FUNCTION PANEL
ALBUMIN: 3.6 g/dL (ref 3.5–5.0)
ALK PHOS: 52 U/L (ref 38–126)
ALT: 18 U/L (ref 14–54)
AST: 21 U/L (ref 15–41)
Bilirubin, Direct: 0.2 mg/dL (ref 0.1–0.5)
Indirect Bilirubin: 0.4 mg/dL (ref 0.3–0.9)
TOTAL PROTEIN: 7.5 g/dL (ref 6.5–8.1)
Total Bilirubin: 0.6 mg/dL (ref 0.3–1.2)

## 2015-07-21 LAB — I-STAT TROPONIN, ED: Troponin i, poc: 0 ng/mL (ref 0.00–0.08)

## 2015-07-21 LAB — TSH: TSH: 1.841 u[IU]/mL (ref 0.350–4.500)

## 2015-07-21 LAB — BASIC METABOLIC PANEL
Anion gap: 7 (ref 5–15)
BUN: 19 mg/dL (ref 6–20)
CALCIUM: 9.2 mg/dL (ref 8.9–10.3)
CO2: 27 mmol/L (ref 22–32)
CREATININE: 0.95 mg/dL (ref 0.44–1.00)
Chloride: 103 mmol/L (ref 101–111)
GFR calc Af Amer: >60 mL/min (ref >60)
GFR calc non Af Amer: 52 mL/min — ABNORMAL LOW (ref >60)
GLUCOSE: 121 mg/dL — AB (ref 65–99)
Potassium: 4.6 mmol/L (ref 3.5–5.1)
Sodium: 137 mmol/L (ref 135–145)

## 2015-07-21 LAB — URINE MICROSCOPIC-ADD ON

## 2015-07-21 LAB — LIPASE, BLOOD: Lipase: 17 U/L (ref 11–51)

## 2015-07-21 LAB — BRAIN NATRIURETIC PEPTIDE: B NATRIURETIC PEPTIDE 5: 592.1 pg/mL — AB (ref 0.0–100.0)

## 2015-07-21 MED ORDER — ENOXAPARIN SODIUM 40 MG/0.4ML ~~LOC~~ SOLN
40.0000 mg | SUBCUTANEOUS | Status: DC
  Administered 2015-07-21 – 2015-07-22 (×2): 40 mg via SUBCUTANEOUS
  Filled 2015-07-21 (×3): qty 0.4

## 2015-07-21 MED ORDER — FUROSEMIDE 10 MG/ML IJ SOLN
40.0000 mg | Freq: Once | INTRAMUSCULAR | Status: AC
  Administered 2015-07-21: 40 mg via INTRAVENOUS
  Filled 2015-07-21: qty 4

## 2015-07-21 MED ORDER — GUAIFENESIN ER 600 MG PO TB12: 600.0000 mg | ORAL_TABLET | Freq: Two times a day (BID) | ORAL | Status: DC

## 2015-07-21 MED ORDER — GUAIFENESIN ER 600 MG PO TB12: 1200.0000 mg | ORAL_TABLET | Freq: Every day | ORAL | Status: DC | PRN

## 2015-07-21 MED ORDER — POTASSIUM CHLORIDE CRYS ER 10 MEQ PO TBCR
10.0000 meq | EXTENDED_RELEASE_TABLET | Freq: Two times a day (BID) | ORAL | Status: DC
  Administered 2015-07-21 – 2015-07-23 (×4): 10 meq via ORAL
  Filled 2015-07-21 (×5): qty 1

## 2015-07-21 MED ORDER — SODIUM CHLORIDE 0.9% FLUSH
3.0000 mL | Freq: Two times a day (BID) | INTRAVENOUS | Status: DC
  Administered 2015-07-21 – 2015-07-23 (×4): 3 mL via INTRAVENOUS

## 2015-07-21 MED ORDER — METOPROLOL SUCCINATE ER 50 MG PO TB24
50.0000 mg | ORAL_TABLET | Freq: Two times a day (BID) | ORAL | Status: DC
  Administered 2015-07-21 – 2015-07-22 (×2): 50 mg via ORAL
  Filled 2015-07-21 (×2): qty 1

## 2015-07-21 MED ORDER — FUROSEMIDE 40 MG PO TABS
40.0000 mg | ORAL_TABLET | Freq: Every day | ORAL | Status: DC
  Administered 2015-07-22 – 2015-07-23 (×2): 40 mg via ORAL
  Filled 2015-07-21 (×2): qty 1

## 2015-07-21 MED ORDER — LORATADINE 10 MG PO TABS
10.0000 mg | ORAL_TABLET | Freq: Every day | ORAL | Status: DC
  Administered 2015-07-22 – 2015-07-23 (×2): 10 mg via ORAL
  Filled 2015-07-21 (×2): qty 1

## 2015-07-21 MED ORDER — RALOXIFENE HCL 60 MG PO TABS
60.0000 mg | ORAL_TABLET | Freq: Every day | ORAL | Status: DC
  Administered 2015-07-22 – 2015-07-23 (×2): 60 mg via ORAL
  Filled 2015-07-21 (×2): qty 1

## 2015-07-21 MED ORDER — ATORVASTATIN CALCIUM 20 MG PO TABS
20.0000 mg | ORAL_TABLET | Freq: Every day | ORAL | Status: DC
  Filled 2015-07-21 (×2): qty 1

## 2015-07-21 MED ORDER — DOCUSATE SODIUM 100 MG PO CAPS: 100.0000 mg | ORAL_CAPSULE | ORAL | Status: DC

## 2015-07-21 MED ORDER — ACETAMINOPHEN 650 MG RE SUPP: 650.0000 mg | Freq: Four times a day (QID) | RECTAL | Status: DC | PRN

## 2015-07-21 MED ORDER — GUAIFENESIN ER 600 MG PO TB12
600.0000 mg | ORAL_TABLET | Freq: Two times a day (BID) | ORAL | Status: DC
  Administered 2015-07-21 – 2015-07-23 (×4): 600 mg via ORAL
  Filled 2015-07-21 (×5): qty 1

## 2015-07-21 MED ORDER — POLYETHYLENE GLYCOL 3350 17 G PO PACK: 17.0000 g | PACK | Freq: Every day | ORAL | Status: DC | PRN

## 2015-07-21 MED ORDER — POTASSIUM CHLORIDE CRYS ER 10 MEQ PO TBCR: 10.0000 meq | EXTENDED_RELEASE_TABLET | Freq: Two times a day (BID) | ORAL | Status: DC

## 2015-07-21 MED ORDER — PANTOPRAZOLE SODIUM 40 MG PO TBEC
40.0000 mg | DELAYED_RELEASE_TABLET | Freq: Every day | ORAL | Status: DC
  Administered 2015-07-21 – 2015-07-23 (×3): 40 mg via ORAL
  Filled 2015-07-21 (×3): qty 1

## 2015-07-21 MED ORDER — ALBUTEROL SULFATE (2.5 MG/3ML) 0.083% IN NEBU: 5.0000 mg | INHALATION_SOLUTION | Freq: Once | RESPIRATORY_TRACT | Status: DC

## 2015-07-21 MED ORDER — ACETAMINOPHEN 325 MG PO TABS
650.0000 mg | ORAL_TABLET | Freq: Four times a day (QID) | ORAL | Status: DC | PRN
  Administered 2015-07-21 – 2015-07-22 (×2): 650 mg via ORAL
  Filled 2015-07-21 (×2): qty 2

## 2015-07-21 MED ORDER — IPRATROPIUM-ALBUTEROL 0.5-2.5 (3) MG/3ML IN SOLN: 3.0000 mL | Freq: Four times a day (QID) | RESPIRATORY_TRACT | Status: DC

## 2015-07-21 MED ORDER — DOCUSATE SODIUM 100 MG PO CAPS
100.0000 mg | ORAL_CAPSULE | ORAL | Status: DC
  Administered 2015-07-21 – 2015-07-23 (×2): 100 mg via ORAL
  Filled 2015-07-21 (×2): qty 1

## 2015-07-21 MED ORDER — ALBUTEROL SULFATE (2.5 MG/3ML) 0.083% IN NEBU: 2.5000 mg | INHALATION_SOLUTION | RESPIRATORY_TRACT | Status: DC | PRN

## 2015-07-21 MED ORDER — BISACODYL 5 MG PO TBEC: 5.0000 mg | DELAYED_RELEASE_TABLET | Freq: Every day | ORAL | Status: DC | PRN

## 2015-07-21 MED ORDER — IPRATROPIUM-ALBUTEROL 0.5-2.5 (3) MG/3ML IN SOLN
3.0000 mL | Freq: Four times a day (QID) | RESPIRATORY_TRACT | Status: DC
  Administered 2015-07-21: 3 mL via RESPIRATORY_TRACT
  Filled 2015-07-21: qty 3

## 2015-07-21 MED ORDER — LEVOTHYROXINE SODIUM 75 MCG PO TABS
75.0000 ug | ORAL_TABLET | Freq: Every day | ORAL | Status: DC
  Administered 2015-07-22 – 2015-07-23 (×2): 75 ug via ORAL
  Filled 2015-07-21 (×2): qty 1

## 2015-07-21 MED ORDER — ATORVASTATIN CALCIUM 20 MG PO TABS: 20.0000 mg | ORAL_TABLET | Freq: Every day | ORAL | Status: DC

## 2015-07-21 NOTE — ED Provider Notes (Signed)
Emergency Department Provider Note  Time seen: Approximately 11:04 AM  I have reviewed the triage vital signs and the nursing notes.   HISTORY  Chief Complaint Shortness of Breath  HPI Tina Good is a 82 y.o. female with PMH of a-fib and CHF (EF 40-45%) on 07/09/15 ECHO, presents to the emergency department for evaluation of progressive shortness of breath over the past 3 days worsening overnight. The patient has been taking double her dose of Lasix with good urine output and no significant improvement in symptoms. She denies any associated chest pain. The patient's family member at bedside became concerned when she became nauseated in addition to significant shortness of breath this morning. She is not currently anticoagulated for her A. fib given concern for falls. She is followed by a cardiologist, Dr. Ann Maki, in the [facility] system. She denies any associated fever, chills, productive cough. She does note some abdominal fullness but denies overt pain. She was treated for UTI 1-2 weeks prior with no current dysuria.   Past Medical History  Diagnosis Date  . Hypertension   . Hyperlipidemia   . Pneumonia   . CHF (congestive heart failure) (HCC)   . Arthritis   . Coronary artery disease     2007  . Myocardial infarction (HCC)   . Shortness of breath   . GERD (gastroesophageal reflux disease)     Patient Active Problem List   Diagnosis Date Noted  . Hypokalemia 01/14/2012  . PNA (pneumonia) 01/14/2012  . CAD (coronary artery disease) 01/14/2012  . LBBB (left bundle branch block) 01/14/2012  . M A I/MYCOBACTERIAL DISEASE 04/13/2009  . HYPERLIPIDEMIA 04/13/2009  . HYPERTENSION 04/13/2009  . ATRIAL FIBRILLATION 04/13/2009  . BRONCHIECTASIS W/O ACUTE EXACERBATION 04/13/2009  . Cough 04/13/2009  . CHEST PAIN 04/13/2009    Past Surgical History  Procedure Laterality Date  . Abdominal hysterectomy    . Cardiac bypass    . Coronary artery bypass graft    . Coronary  angioplasty with stent placement    . Eye surgery      cataracts  . Back surgery      Current Outpatient Rx  Name  Route  Sig  Dispense  Refill  . albuterol (PROVENTIL HFA;VENTOLIN HFA) 108 (90 BASE) MCG/ACT inhaler   Inhalation   Inhale 2 puffs into the lungs every 6 (six) hours as needed for wheezing.   1 Inhaler   2   . aspirin 81 MG tablet   Oral   Take 81 mg by mouth daily.         Marland Kitchen atorvastatin (LIPITOR) 20 MG tablet   Oral   Take 20 mg by mouth daily.         . benzonatate (TESSALON) 100 MG capsule   Oral   Take 100 mg by mouth 3 (three) times daily as needed. For cough         . calcium carbonate (TUMS EX) 750 MG chewable tablet   Oral   Chew 1 tablet by mouth 2 (two) times daily.         . Cholecalciferol (VITAMIN D-3 PO)   Oral   Take 1 tablet by mouth daily.         Marland Kitchen docusate sodium (COLACE) 100 MG capsule   Oral   Take 100 mg by mouth 3 (three) times a week.         . furosemide (LASIX) 40 MG tablet   Oral   Take 40 mg by mouth daily.         Marland Kitchen  guaiFENesin (MUCINEX) 600 MG 12 hr tablet   Oral   Take 600 mg by mouth daily as needed. For cough         . levothyroxine (SYNTHROID, LEVOTHROID) 75 MCG tablet   Oral   Take 75 mcg by mouth daily.         Marland Kitchen. loratadine (CLARITIN) 10 MG tablet   Oral   Take 10 mg by mouth daily.         . metoprolol tartrate (LOPRESSOR) 25 MG tablet   Oral   Take 25 mg by mouth 2 (two) times daily.         . Multiple Vitamin (MULTIVITAMIN WITH MINERALS) TABS   Oral   Take 1 tablet by mouth daily.         Marland Kitchen. omeprazole (PRILOSEC) 20 MG capsule   Oral   Take 40 mg by mouth daily.         Marland Kitchen. oseltamivir (TAMIFLU) 75 MG capsule   Oral   Take 1 capsule (75 mg total) by mouth 2 (two) times daily.   8 capsule   0   . potassium chloride SA (K-DUR,KLOR-CON) 20 MEQ tablet   Oral   Take 20 mEq by mouth 2 (two) times daily.         . raloxifene (EVISTA) 60 MG tablet   Oral   Take 60 mg by  mouth daily.           Allergies Review of patient's allergies indicates no known allergies.  No family history on file.  Social History Social History  Substance Use Topics  . Smoking status: Never Smoker   . Smokeless tobacco: Never Used  . Alcohol Use: No    Review of Systems Constitutional: No fever/chills Eyes: No visual changes. ENT: No sore throat. Cardiovascular: Denies chest pain. Respiratory: Positive shortness of breath. Gastrointestinal: No abdominal pain but endorses fullness.  Positive nausea, no vomiting.  No diarrhea.  No constipation. Genitourinary: Negative for dysuria. Musculoskeletal: Negative for back pain. Skin: Negative for rash. Neurological: Negative for headaches, focal weakness or numbness.  10-point ROS otherwise negative.  ____________________________________________   PHYSICAL EXAM:  VITAL SIGNS: ED Triage Vitals  Enc Vitals Group     BP 07/21/15 1019 162/63 mmHg     Pulse Rate 07/21/15 1019 71     Resp 07/21/15 1019 16     Temp 07/21/15 1019 98.3 F (36.8 C)     Temp Source 07/21/15 1019 Oral     SpO2 07/21/15 1019 95 %     Weight 07/21/15 1019 136 lb (61.689 kg)     Height 07/21/15 1019 5' (1.524 m)    Constitutional: Alert and oriented. Well appearing and in no acute distress. Eyes: Conjunctivae are normal. EOMI. Head: Atraumatic. Ears:  Healthy appearing ear canals. Nose: No congestion/rhinnorhea. Mouth/Throat: Mucous membranes are moist.  Oropharynx non-erythematous. Neck: No stridor.   Cardiovascular: Irregularly irregular rhythm. Rate controlled. Good peripheral circulation. No clear murmurs.  Respiratory: Increased respiratory effort with tachypnea. No retractions. Lungs with faint crackles at the bases.  Gastrointestinal: Soft and nontender. No distention.  Musculoskeletal: No lower extremity tenderness nor edema. No gross deformities of extremities. Neurologic:  Normal speech and language. No gross focal neurologic  deficits are appreciated.  Skin:  Skin is warm, dry and intact. No rash noted. Psychiatric: Mood and affect are normal. Speech and behavior are normal.  ____________________________________________   LABS (all labs ordered are listed, but only abnormal results are displayed)  Labs Reviewed  BASIC METABOLIC PANEL - Abnormal; Notable for the following:    Glucose, Bld 121 (*)    GFR calc non Af Amer 52 (*)    All other components within normal limits  URINALYSIS, ROUTINE W REFLEX MICROSCOPIC (NOT AT [facility]) - Abnormal; Notable for the following:    Leukocytes, UA TRACE (*)    All other components within normal limits  BRAIN NATRIURETIC PEPTIDE - Abnormal; Notable for the following:    B Natriuretic Peptide 592.1 (*)    All other components within normal limits  URINE MICROSCOPIC-ADD ON - Abnormal; Notable for the following:    Squamous Epithelial / LPF 0-5 (*)    Bacteria, UA RARE (*)    Casts HYALINE CASTS (*)    All other components within normal limits  CBC  LIPASE, BLOOD  TSH  HEPATIC FUNCTION PANEL  TROPONIN I  TROPONIN I  I-STAT TROPOININ, ED   ____________________________________________  EKG  A fib with old anterior infarct. Prolonged QTc: 509. No STEMI.  ____________________________________________  RADIOLOGY  Dg Chest 2 View  07/21/2015  CLINICAL DATA:  Increasing shortness of breath. Chest congestion. Dry cough. Chest tightness and pressure for a few weeks. EXAM: CHEST  2 VIEW COMPARISON:  01/17/2012 FINDINGS: Patient is status post CABG. There is mild cardiomegaly. Diffuse interstitial prominence throughout the lungs, likely chronic interstitial lung disease. Mild hyperinflation. Scarring in the lower lobes. Multiple old right rib fractures. Is mild compression fractures in the mid and lower thoracic spine, stable since 2013. IMPRESSION: Cardiomegaly. Mild COPD. Probable chronic interstitial lung disease and bibasilar scarring. No acute findings. Electronically  Signed   By: Charlett Nose M.D.   On: 07/21/2015 11:14    ____________________________________________   PROCEDURES  Procedure(s) performed:   Procedures  None ____________________________________________   INITIAL IMPRESSION / ASSESSMENT AND PLAN / ED COURSE  Pertinent labs & imaging results that were available during my care of the patient were reviewed by me and considered in my medical decision making (see chart for details).  Patient with past medical history of atrial fibrillation and congestive heart failure presents to the emergency department with progressive dyspnea and new onset nausea this morning. She is awake and alert, speaking in full sentences but does have some tachypnea with faint crackles at the bases. Chest x-ray pending. Suspect volume overload. Patient with A. fib and EKG that seems consistent with prior in the system. Plan for labs including i-STAT troponin, TSH, BNP, and serial exams.  12:38 PM Updated patient and family. Spoke with Hospitalist regarding admission. Placed order for observation, telemetry.  ____________________________________________  FINAL CLINICAL IMPRESSION(S) / ED DIAGNOSES  Final diagnoses:  Dyspnea  LBBB (left bundle branch block)  Atrial fibrillation, unspecified type (HCC)     MEDICATIONS GIVEN DURING THIS VISIT:  Medications  metoprolol succinate (TOPROL-XL) 24 hr tablet 50 mg (not administered)  docusate sodium (COLACE) capsule 100 mg (not administered)  guaiFENesin (MUCINEX) 12 hr tablet 1,200 mg (not administered)  loratadine (CLARITIN) tablet 10 mg (not administered)  pantoprazole (PROTONIX) EC tablet 40 mg (not administered)  potassium chloride SA (K-DUR,KLOR-CON) CR tablet 10 mEq (not administered)  atorvastatin (LIPITOR) tablet 20 mg (not administered)  levothyroxine (SYNTHROID, LEVOTHROID) tablet 75 mcg (not administered)  raloxifene (EVISTA) tablet 60 mg (not administered)  acetaminophen (TYLENOL) tablet 650 mg  (not administered)    Or  acetaminophen (TYLENOL) suppository 650 mg (not administered)  polyethylene glycol (MIRALAX / GLYCOLAX) packet 17 g (not administered)  bisacodyl (DULCOLAX) EC  tablet 5 mg (not administered)  albuterol (PROVENTIL) (2.5 MG/3ML) 0.083% nebulizer solution 2.5 mg (not administered)  guaiFENesin (MUCINEX) 12 hr tablet 600 mg (not administered)  enoxaparin (LOVENOX) injection 40 mg (not administered)  sodium chloride flush (NS) 0.9 % injection 3 mL (not administered)  ipratropium-albuterol (DUONEB) 0.5-2.5 (3) MG/3ML nebulizer solution 3 mL (not administered)  furosemide (LASIX) injection 40 mg (40 mg Intravenous Given 07/21/15 1313)     NEW OUTPATIENT MEDICATIONS STARTED DURING THIS VISIT:  None   Note:  This document was prepared using Dragon voice recognition software and may include unintentional dictation errors.  Alona BeneJoshua Jarod Bozzo, MD Emergency Medicine  Maia PlanJoshua G Jesscia Imm, MD 07/21/15 [id number]

## 2015-07-21 NOTE — ED Notes (Signed)
Patient transported to X-ray 

## 2015-07-21 NOTE — ED Notes (Signed)
patient here with increasing shortness of breath the past few days and recent ED visit late June for UTI and new onset/recurrent Atrial Fib with that visit. Speaking complete sentences, NAD Denies CP.

## 2015-07-21 NOTE — H&P (Signed)
History and Physical    Tina CashOpal Willison ZOX:[id number] DOB: [birth year] DOA: 07/21/2015  PCP: No primary care provider on file.  Patient coming from:   Home    Chief Complaint:  Dyspnea,  nausea  HPI: Tina Good is a 82 y.o. female [place] son. Patient has a past medical history significant for congestive heart failure, CAD / CABG. GERD, and hypothyroidism . She presents with several day history of dyspnea. [facility] , advised to  double her Lasix for 3 days at home for CHF. Urine output has been good but no significant improvement in breathing. No orthopnea, patient is still able to sleep on one pillow . Dyspnea worse with exertion . Patient has a dry cough , previously this was secondary to GERD . No fevers or chills . No chest pain.  She complains of nausea today. Patient should be some nausea to taking her home medications on an empty stomach.   ED course:  Hemodynamically stable, afebrile.  Chest x-ray without acute findings. Mild COPD, cardiomegaly and probable chronic interstitial lung disease BNP 592, troponin negative 0.0 WBC normal, urinalysis essentially unremarkable EKG shows A. fib with slow ventricular response  Review of Systems:  Positive for hoarseness, chronic . As per HPI, otherwise 10 point review of systems negative.   Past Medical History  Diagnosis Date  . Hypertension   . Hyperlipidemia   . Pneumonia   . CHF (congestive heart failure) (HCC)   . Arthritis   . Coronary artery disease     2007  . Myocardial infarction (HCC)   . GERD (gastroesophageal reflux disease)     Past Surgical History  Procedure Laterality Date  . Abdominal hysterectomy    . Cardiac bypass    . Coronary artery bypass graft    . Coronary angioplasty with stent placement    . Eye surgery      cataracts  . Back surgery      Social History   Social History  . Marital Status: Married    Spouse Name: N/A  . Number of  Children: N/A  . Years of Education: N/A   Occupational History  . Not on file.   Social History Main Topics  . Smoking status: Never Smoker   . Smokeless tobacco: Never Used  . Alcohol Use: No  . Drug Use: No  . Sexual Activity: Not on file   Other Topics Concern  . Not on file   Social History Narrative   [place]. Still drives limited distances. Children involved in care Allergies  Allergen Reactions  . Procaine     Increases heart rate     Family History  Problem Relation Age of Onset  . Heart disease      Prior to Admission medications   Medication Sig Start Date End Date Taking? Authorizing Provider  albuterol (PROVENTIL HFA;VENTOLIN HFA) 108 (90 BASE) MCG/ACT inhaler Inhale 2 puffs into the lungs every 6 (six) hours as needed for wheezing. 01/18/12  Yes Leroy SeaPrashant K Singh, MD  aspirin 81 MG tablet Take 81 mg by mouth daily.   Yes Historical Provider, MD  atorvastatin (LIPITOR) 20 MG tablet Take 20 mg by mouth daily.   Yes Historical Provider, MD  calcium carbonate (TUMS EX) 750 MG chewable tablet Chew 1 tablet by mouth 2 (two) times daily.   Yes Historical Provider, MD  Cholecalciferol (VITAMIN D-3 PO) Take 1 tablet by mouth daily.  Yes Historical Provider, MD  docusate sodium (COLACE) 100 MG capsule Take 100 mg by mouth 3 (three) times a week.   Yes Historical Provider, MD  furosemide (LASIX) 40 MG tablet Take 40 mg by mouth daily.   Yes Historical Provider, MD  guaiFENesin (MUCINEX) 600 MG 12 hr tablet Take 600 mg by mouth daily as needed. For cough   Yes Historical Provider, MD  levothyroxine (SYNTHROID, LEVOTHROID) 75 MCG tablet Take 75 mcg by mouth daily.   Yes Historical Provider, MD  loratadine (CLARITIN) 10 MG tablet Take 10 mg by mouth daily.   Yes Historical Provider, MD  metoprolol succinate (TOPROL-XL) 50 MG 24 hr tablet Take 1 tablet by mouth 2 (two) times daily.  05/31/15  Yes Historical Provider, MD  metoprolol tartrate  (LOPRESSOR) 25 MG tablet Take 25 mg by mouth 2 (two) times daily.   Yes Historical Provider, MD  Multiple Vitamin (MULTIVITAMIN WITH MINERALS) TABS Take 1 tablet by mouth daily.   Yes Historical Provider, MD  omeprazole (PRILOSEC) 20 MG capsule Take 40 mg by mouth daily.   Yes Historical Provider, MD  potassium chloride SA (K-DUR,KLOR-CON) 20 MEQ tablet Take 10 mEq by mouth 2 (two) times daily.    Yes Historical Provider, MD  raloxifene (EVISTA) 60 MG tablet Take 60 mg by mouth daily.   Yes Historical Provider, MD  benzonatate (TESSALON) 100 MG capsule Take 100 mg by mouth 3 (three) times daily as needed. For cough 12/27/11   Pearline Cables, MD  oseltamivir (TAMIFLU) 75 MG capsule Take 1 capsule (75 mg total) by mouth 2 (two) times daily. 01/18/12   Leroy Sea, MD    Physical Exam: Filed Vitals:   07/21/15 1019 07/21/15 1047 07/21/15 1148 07/21/15 1230  BP: 162/63 145/63 152/82 145/71  Pulse: 71 61 64 55  Temp: 98.3 F (36.8 C)     TempSrc: Oral     Resp: Height: 5' (1.524 m)     Weight: 61.689 kg (136 lb)     SpO2: 95% 94% 91% 95%    Constitutional:  Pleasant, well-developed white female in NAD, calm, comfortable Filed Vitals:   07/21/15 1019 07/21/15 1047 07/21/15 1148 07/21/15 1230  BP: 162/63 145/63 152/82 145/71  Pulse: 71 61 64 55  Temp: 98.3 F (36.8 C)     TempSrc: Oral     Resp: Height: 5' (1.524 m)     Weight: 61.689 kg (136 lb)     SpO2: 95% 94% 91% 95%   Eyes: PER, lids and conjunctivae normal ENMT: Mucous membranes are moist. Posterior pharynx clear of any exudate or lesions..  Neck: normal, supple, no masses Respiratory: Fine bibasilar crackles , no wheezing. Normal respiratory effort. No accessory muscle use.  Cardiovascular: Regular rate , irregular. No rubs / gallops. No extremity edema. 2+ pedal pulses.   Abdomen: no tenderness, no masses palpated. No hepatomegaly. Bowel sounds positive.  Musculoskeletal: no clubbing /  cyanosis. No joint deformity upper and lower extremities. Good ROM, no contractures. Normal muscle tone.  Skin: no rashes, lesions, ulcers.  Neurologic: CN 2-12 grossly intact. Sensation intact, Strength 5/5 in all 4.  Psychiatric: Normal judgment and insight. Alert and oriented x 3. Normal mood.    Labs on Admission: I have personally reviewed following labs and imaging studies   Urine analysis:    Component Value Date/Time   COLORURINE YELLOW 07/21/2015 1145   APPEARANCEUR CLEAR 07/21/2015 1145  LABSPEC 1.011 07/21/2015 1145   PHURINE 7.5 07/21/2015 1145   GLUCOSEU NEGATIVE 07/21/2015 1145   HGBUR NEGATIVE 07/21/2015 1145   BILIRUBINUR NEGATIVE 07/21/2015 1145   KETONESUR NEGATIVE 07/21/2015 1145   PROTEINUR NEGATIVE 07/21/2015 1145   UROBILINOGEN 1.0 01/17/2012 0752   NITRITE NEGATIVE 07/21/2015 1145   LEUKOCYTESUR TRACE* 07/21/2015 1145    Radiological Exams on Admission: Dg Chest 2 View  07/21/2015  CLINICAL DATA:  Increasing shortness of breath. Chest congestion. Dry cough. Chest tightness and pressure for a few weeks. EXAM: CHEST  2 VIEW COMPARISON:  01/17/2012 FINDINGS: Patient is status post CABG. There is mild cardiomegaly. Diffuse interstitial prominence throughout the lungs, likely chronic interstitial lung disease. Mild hyperinflation. Scarring in the lower lobes. Multiple old right rib fractures. Is mild compression fractures in the mid and lower thoracic spine, stable since 2013. IMPRESSION: Cardiomegaly. Mild COPD. Probable chronic interstitial lung disease and bibasilar scarring. No acute findings. Electronically Signed   By: Charlett Nose M.D.   On: 07/21/2015 11:14    EKG: Independently reviewed.   EKG Interpretation  Date/Time:  Wednesday July 21 2015 10:19:48 EDT Ventricular Rate:  56 PR Interval:    QRS Duration: 156 QT Interval:  474 QTC Calculation: 457 R Axis:   -167 Text Interpretation:  Atrial fibrillation with slow ventricular response Right  superior axis deviation Non-specific intra-ventricular conduction block Abnormal ECG Confirmed by LONG MD, JOSHUA [id number]) on 07/21/2015 10:28:30 AM      CareEverywhere [facility]   Echo 6/23/17FINDINGS  STUDY QUALITY-  The study quality is fair.    LEFT VENTRICLE-  The left ventricular chamber size is normal. Mild concentric left  ventricular hypertrophy is observed. The estimated ejection fraction is  40-45%.    LEFT ATRIUM-  The left atrium is severely dilated.    RIGHT ATRIUM-  The right atrial cavity size is severely dilated.    AORTIC VALVE-  The aortic valve is trileaflet. The aortic valve leaflets are moderately  thickened. There is moderate to severe aortic stenosis. The mean  gradient of the aortic valve is 18.2 mmHg.The peak instantaneous  gradient of the aortic valve is 34.65 mmHg.The aortic valve area, by  peak velocities, is calculated at 0.94 cm2.    MITRAL VALVE-  The mitral valve leaflets appear normal. There is mitral annular  calcification. There is mild to moderate mitral regurgitation.    TRICUSPID VALVE-  There is moderate tricuspid regurgitation. The right ventricular  systolic pressure is calculated at 75.18 mmHg.There is evidence of  severe pulmonary hypertension.    PULMONIC VALVE-  There is no evidence of pulmonic regurgitation.    PERICARDIUM-  There is no pericardial effusion.    AORTA-  There is no dilatation of the aortic root.    VENOUS-  The inferior vena cava is dilated.    Wall Motion-  Themid inferoseptal, andapical lateral wall segments are  hypokinetic. Theapical septal wall segment is akinetic.    CONCLUSIONS-    The left ventricular chamber size is normal.  Mild concentric left ventricular hypertrophy is observed.  The estimated ejection fraction is 40-45%.  The left atrium is severely dilated.  The aortic valve is trileaflet.  The aortic  valve leaflets are moderately thickened.  There is moderate to severe aortic stenosis.  The mean gradient of the aortic valve is 18.2 mmHg.  The peak instantaneous gradient of the aortic valve is 34.65 mmHg.  The aortic valve area, by peak velocities, is calculated at 0.94  cm2.  There is mitral annular calcification.  There is mild to moderate mitral regurgitation.  There is moderate tricuspid regurgitation.  There is evidence of severe pulmonary hypertension.  The right ventricular systolic pressure is calculated at 75.18 mmHg.  There is no pericardial effusion.  Themid inferoseptal, andapical lateral wall segments are  hypokinetic.  Theapical septal wall segment is akinetic.    07/08/15  TSH 1.43  07/17/15     [id number] Negative troponin < 0.03   Assessment; Plan   Dyspnea.  Hx of CHF, echo late June with severe pulmonary HTN, EF 40-45%. Normal 02 sats, no Differential diagnosis:  Decompensated CHF? No increased WOB. Clinically doesn't appear to be volume overload but BNP is 592 (down from 744 on July 1st after doubling lasix).  Pulmonary disease? Mild COPD / interstitial lung disease on CXR.   Cardiac origin? No ischemia on EKG / Initial trop negative but + hx of CAD.  Of note Beta blocker recently increased. HR in 50's. Her baseline is in 50s to 60's but still something to consider.   -place in OBS- telemetry -Agree with IV lasix x 1 -Monitor intake and output, daily weights -Duoneb Q6 hours and Albuterol prn Q2 -incentive spirometry  -possible stress test in am  Abnormal EKG. Non-specific intra-ventricular conduction block,   -repeat EKG in am -telemetry   Prolonged QTc 509.  -avoid QT prolonging medications -check Mg+  Nausea. Patient attributes to taking meds on empty stomach this am. No chest pain. Negative troponin.  -continue to monitor  Atrial fibrillation, rate controlled. Helene Shoe.Chadsvasc 5 -Previously refused anticoagulation  Hypothyroidism TSH  normal in late June. -Continue home Synthroid .  Hypertension, controlled. On Beta blocker -continue beta blocker  GERD. No heartburn.  Has dry cough which Pulmonary previously attributed by reflux .  DVT prophylaxis:   Lovenox Code Status:     DNR Family Communication:   Discussed plan to treatment with Son in room, he understands and agrees with plan  Disposition Plan: Discharge home in 24-48 hours              Consults called:   none  Admission status:  Observation Telemetry   Willette ClusterPaula Horst Ostermiller NP [facility] Pager [phone]  If 7PM-7AM, please contact night-coverage [email or web address] Password [email or web address]  07/21/2015, 12:43 PM

## 2015-07-22 ENCOUNTER — Observation Stay (HOSPITAL_BASED_OUTPATIENT_CLINIC_OR_DEPARTMENT_OTHER): Payer: Medicare Other

## 2015-07-22 DIAGNOSIS — E785 Hyperlipidemia, unspecified: Secondary | ICD-10-CM

## 2015-07-22 DIAGNOSIS — I25119 Atherosclerotic heart disease of native coronary artery with unspecified angina pectoris: Secondary | ICD-10-CM

## 2015-07-22 DIAGNOSIS — I10 Essential (primary) hypertension: Secondary | ICD-10-CM

## 2015-07-22 DIAGNOSIS — I4891 Unspecified atrial fibrillation: Secondary | ICD-10-CM | POA: Diagnosis not present

## 2015-07-22 DIAGNOSIS — I509 Heart failure, unspecified: Secondary | ICD-10-CM | POA: Diagnosis not present

## 2015-07-22 DIAGNOSIS — R0600 Dyspnea, unspecified: Secondary | ICD-10-CM

## 2015-07-22 DIAGNOSIS — I482 Chronic atrial fibrillation: Secondary | ICD-10-CM | POA: Diagnosis not present

## 2015-07-22 LAB — BASIC METABOLIC PANEL
ANION GAP: 8 (ref 5–15)
BUN: 19 mg/dL (ref 6–20)
CO2: 27 mmol/L (ref 22–32)
Calcium: 9 mg/dL (ref 8.9–10.3)
Chloride: 103 mmol/L (ref 101–111)
Creatinine, Ser: 1.01 mg/dL — ABNORMAL HIGH (ref 0.44–1.00)
GFR calc Af Amer: 56 mL/min — ABNORMAL LOW (ref >60)
GFR, EST NON AFRICAN AMERICAN: 48 mL/min — AB (ref >60)
GLUCOSE: 98 mg/dL (ref 65–99)
POTASSIUM: 3.7 mmol/L (ref 3.5–5.1)
Sodium: 138 mmol/L (ref 135–145)

## 2015-07-22 LAB — CBC
HEMATOCRIT: 37.2 % (ref 36.0–46.0)
HEMOGLOBIN: 12.1 g/dL (ref 12.0–15.0)
MCH: 30.3 pg (ref 26.0–34.0)
MCHC: 32.5 g/dL (ref 30.0–36.0)
MCV: 93 fL (ref 78.0–100.0)
Platelets: 182 10*3/uL (ref 150–400)
RBC: 4 MIL/uL (ref 3.87–5.11)
RDW: 13 % (ref 11.5–15.5)
WBC: 7.2 10*3/uL (ref 4.0–10.5)

## 2015-07-22 LAB — ECHOCARDIOGRAM COMPLETE
AO mean calculated velocity dopler: 201 cm/s
AOPV: 0.39 m/s
AV Mean grad: 19 mmHg
AV Peak grad: 40 mmHg
AV peak Index: 0.52
AV vel: 0.79
AVAREAMEANV: 0.82 cm2
AVAREAMEANVIN: 0.54 cm2/m2
AVAREAVTI: 0.79 cm2
AVAREAVTIIND: 0.52 cm2/m2
AVCELMEANRAT: 0.41
AVLVOTPG: 6 mmHg
AVPKVEL: 318 cm/s
CHL CUP AV VALUE AREA INDEX: 0.52
FS: 35 % (ref 28–44)
Height: 58 in
IV/PV OW: 1
LA ID, A-P, ES: 45 mm
LA diam index: 2.96 cm/m2
LA vol A4C: 60.2 ml
LA vol: 72.7 mL
LAVOLIN: 47.8 mL/m2
LDCA: 2.01 cm2
LEFT ATRIUM END SYS DIAM: 45 mm
LV PW d: 17 mm — AB (ref 0.6–1.1)
LVOT SV: 53 mL
LVOT VTI: 26.3 cm
LVOTD: 16 mm
LVOTPV: 125 cm/s
LVOTVTI: 0.39 cm
RV TAPSE: 16.1 mm
Reg peak vel: 344 cm/s
TRMAXVEL: 344 cm/s
VTI: 66.7 cm
Valve area: 0.79 cm2
Weight: 2107.2 oz

## 2015-07-22 LAB — TROPONIN I: Troponin I: <0.03 ng/mL (ref <0.03)

## 2015-07-22 LAB — MAGNESIUM: Magnesium: 2.2 mg/dL (ref 1.7–2.4)

## 2015-07-22 MED ORDER — ADULT MULTIVITAMIN W/MINERALS CH
1.0000 | ORAL_TABLET | Freq: Every day | ORAL | Status: DC
  Administered 2015-07-22 – 2015-07-23 (×2): 1 via ORAL
  Filled 2015-07-22 (×2): qty 1

## 2015-07-22 MED ORDER — METOPROLOL SUCCINATE ER 50 MG PO TB24
50.0000 mg | ORAL_TABLET | Freq: Every day | ORAL | Status: DC
  Administered 2015-07-23: 50 mg via ORAL
  Filled 2015-07-22: qty 1

## 2015-07-22 MED ORDER — IPRATROPIUM-ALBUTEROL 0.5-2.5 (3) MG/3ML IN SOLN
3.0000 mL | Freq: Four times a day (QID) | RESPIRATORY_TRACT | Status: DC | PRN
Start: 2015-07-22 — End: 2015-07-23

## 2015-07-22 MED ORDER — CALCIUM CARBONATE ANTACID 500 MG PO CHEW
1.5000 | CHEWABLE_TABLET | Freq: Two times a day (BID) | ORAL | Status: DC
  Administered 2015-07-22 – 2015-07-23 (×3): 300 mg via ORAL
  Filled 2015-07-22 (×3): qty 2

## 2015-07-22 MED ORDER — CALCIUM CARBONATE ANTACID 750 MG PO CHEW: 1.0000 | CHEWABLE_TABLET | Freq: Two times a day (BID) | ORAL | Status: DC

## 2015-07-22 NOTE — Care Management Obs Status (Signed)
MEDICARE OBSERVATION STATUS NOTIFICATION   Patient Details  Name: Tina Good MRN: [id number] Date of Birth: 20-May-1924   Medicare Observation Status Notification Given:  Yes    Cherrie DistanceChandler, Charley Miske L, RN 07/22/2015, 2:43 PM

## 2015-07-22 NOTE — Progress Notes (Signed)
  Echocardiogram 2D Echocardiogram has been performed.  Arvil ChacoFoster, Jeryn Bertoni 07/22/2015, 1:44 PM

## 2015-07-22 NOTE — Care Management Note (Signed)
Case Management Note  Patient Details  Name: Tina Good MRN: [id number] Date of Birth: 03-Oct-1924  Subjective/Objective:     Admitted with Atrial Fib               Action/Plan: Patient lives at home alone, she states that she continues to drive and is active in her church. Private insurance with Medicare; [facility] and she also use [facility]; pt reports no problem getting her medication; she continues cook a healthy diet. Patient stated "my cooking must be good cause I'm still alive." She has hired help to assist with house cleaning; She has a cane but states that she does not use it; CM will continue to follow for DCP  Expected Discharge Date:  07/25/2015           Expected Discharge Plan:  Home/Self Care  Discharge planning Services  CM Consult   Choice offered to:  Patient  Status of Service:  In process, will continue to follow  Reola MosherChandler, Khalel Alms L, RN,MHA,BSN [phone] 07/22/2015, 2:44 PM

## 2015-07-22 NOTE — Progress Notes (Signed)
PROGRESS NOTE  Tina Good  KCL:[id number] DOB: May 04, 1924 DOA: 07/21/2015 PCP: No primary care provider on file. Outpatient Specialists:  Subjective: Feels much better, denies any chest pain this morning.  Brief Narrative:  Tina Good is a 82 y.o. female from [place], [place] in [place] visiting son. Patient has a past medical history significant for congestive heart failure, CAD / CABG. GERD, and hypothyroidism . She presents with several day history of dyspnea. Patient evaluated at [facility] 07/17/15 , advised to double her Lasix for 3 days at home for CHF. Urine output has been good but no significant improvement in breathing. No orthopnea, patient is still able to sleep on one pillow . Dyspnea worse with exertion . Patient has a dry cough , previously this was secondary to GERD . No fevers or chills . No chest pain. She complains of nausea today. Patient should be some nausea to taking her home medications on an empty stomach.   ED course:  Hemodynamically stable, afebrile.  Chest x-ray without acute findings. Mild COPD, cardiomegaly and probable chronic interstitial lung disease BNP 592, troponin negative 0.0 WBC normal, urinalysis essentially unremarkable EKG shows A. fib with slow ventricular response   Assessment & Plan:   Principal Problem:   ATRIAL FIBRILLATION Active Problems:   Hyperlipidemia   Hypertension   CAD (coronary artery disease)   Dyspnea   Dyspnea.  Hx of CHF, echo late June with severe pulmonary HTN, EF 40-45%. Normal O2 sats. Slightly elevated BNP at 592, ESR showed cardiomegaly and bibasilar scarring. Improved with diuresis, continue oral Lasix. -Monitor intake and output, daily weights -Duoneb Q6 hours and Albuterol prn Q2 -PT/OT to evaluate and treat.  LBBB -Twelve-lead EKG showed complete left bundle branch block, this is all since 2013. -Negative cardiac enzymes, denies any chest pain. -2-D echocardiogram to be done.  Prolonged QTc  509.  -avoid QT prolonging medications -check Mg+  Nausea. Patient attributes to taking meds on empty stomach this am. No chest pain. Negative troponin.  -continue to monitor  Atrial fibrillation, rate controlled. Tina Good 5 -Previously refused anticoagulation  Hypothyroidism TSH normal in late June. -Continue home Synthroid .  Hypertension, controlled. On Beta blocker -continue beta blocker  GERD. No heartburn. Has dry cough which Pulmonary previously attributed by reflux .   DVT prophylaxis: Lovenox Code Status: DNR Family Communication:  Disposition Plan:  Diet: Diet Heart Room service appropriate?: Yes; Fluid consistency:: Thin  Consultants:   None  Procedures:   None  Antimicrobials:   None   Objective: Filed Vitals:   07/21/15 2140 07/21/15 2322 07/22/15 0337 07/22/15 1028  BP:  157/56 161/68 157/64  Pulse: 67 69 63 96  Temp:  98.4 F (36.9 C) 98.5 F (36.9 C) 97.7 F (36.5 C)  TempSrc:  Oral Oral Oral  Resp: 16 16 15 16   Height:      Weight:   59.739 kg (131 lb 11.2 oz)   SpO2: 92% 94% 94% 95%    Intake/Output Summary (Last 24 hours) at 07/22/15 1211 Last data filed at 07/22/15 1048  Gross per 24 hour  Intake   1070 ml  Output    900 ml  Net    170 ml   Filed Weights   07/21/15 1019 07/21/15 1422 07/22/15 0337  Weight: 61.689 kg (136 lb) 59.376 kg (130 lb 14.4 oz) 59.739 kg (131 lb 11.2 oz)    Examination: General exam: Appears calm and comfortable  Respiratory system: Clear to auscultation. Respiratory effort normal. Cardiovascular system:  S1 & S2 heard, RRR. No JVD, murmurs, rubs, gallops or clicks. No pedal edema. Gastrointestinal system: Abdomen is nondistended, soft and nontender. No organomegaly or masses felt. Normal bowel sounds heard. Central nervous system: Alert and oriented. No focal neurological deficits. Extremities: Symmetric 5 x 5 power. Skin: No rashes, lesions or ulcers Psychiatry: Judgement and insight appear  normal. Mood & affect appropriate.   Data Reviewed: I have personally reviewed following labs and imaging studies  CBC:  Recent Labs Lab 07/21/15 1020 07/22/15 0144  WBC 7.7 7.2  HGB 13.0 12.1  HCT 39.1 37.2  MCV 94.2 93.0  PLT 185 116   Basic Metabolic Panel:  Recent Labs Lab 07/21/15 1020 07/22/15 0144  NA 137 138  K 4.6 3.7  CL 103 103  CO2 27 27  GLUCOSE 121* 98  BUN 19 19  CREATININE 0.95 1.01*  CALCIUM 9.2 9.0   GFR: Estimated Creatinine Clearance: 29.4 mL/min (by C-G formula based on Cr of 1.01). Liver Function Tests:  Recent Labs Lab 07/21/15 1045  AST 21  ALT 18  ALKPHOS 52  BILITOT 0.6  PROT 7.5  ALBUMIN 3.6    Recent Labs Lab 07/21/15 1045  LIPASE 17   No results for input(s): AMMONIA in the last 168 hours. Coagulation Profile: No results for input(s): INR, PROTIME in the last 168 hours. Cardiac Enzymes:  Recent Labs Lab 07/21/15 1406 07/21/15 1955 07/22/15 0144  TROPONINI <0.03 <0.03 <0.03   BNP (last 3 results) No results for input(s): PROBNP in the last 8760 hours. HbA1C: No results for input(s): HGBA1C in the last 72 hours. CBG: No results for input(s): GLUCAP in the last 168 hours. Lipid Profile: No results for input(s): CHOL, HDL, LDLCALC, TRIG, CHOLHDL, LDLDIRECT in the last 72 hours. Thyroid Function Tests:  Recent Labs  07/21/15 1045  TSH 1.841   Anemia Panel: No results for input(s): VITAMINB12, FOLATE, FERRITIN, TIBC, IRON, RETICCTPCT in the last 72 hours. Urine analysis:    Component Value Date/Time   COLORURINE YELLOW 07/21/2015 1145   APPEARANCEUR CLEAR 07/21/2015 1145   LABSPEC 1.011 07/21/2015 1145   PHURINE 7.5 07/21/2015 1145   GLUCOSEU NEGATIVE 07/21/2015 1145   HGBUR NEGATIVE 07/21/2015 1145   BILIRUBINUR NEGATIVE 07/21/2015 1145   KETONESUR NEGATIVE 07/21/2015 1145   PROTEINUR NEGATIVE 07/21/2015 1145   UROBILINOGEN 1.0 01/17/2012 0752   NITRITE NEGATIVE 07/21/2015 1145   LEUKOCYTESUR TRACE*  07/21/2015 1145   Sepsis Labs: @LABRCNTIP (procalcitonin:4,lacticidven:4)  )No results found for this or any previous visit (from the past 240 hour(s)).   Invalid input(s): PROCALCITONIN, LACTICACIDVEN   Radiology Studies: Dg Chest 2 View  07/21/2015  CLINICAL DATA:  Increasing shortness of breath. Chest congestion. Dry cough. Chest tightness and pressure for a few weeks. EXAM: CHEST  2 VIEW COMPARISON:  01/17/2012 FINDINGS: Patient is status post CABG. There is mild cardiomegaly. Diffuse interstitial prominence throughout the lungs, likely chronic interstitial lung disease. Mild hyperinflation. Scarring in the lower lobes. Multiple old right rib fractures. Is mild compression fractures in the mid and lower thoracic spine, stable since 2013. IMPRESSION: Cardiomegaly. Mild COPD. Probable chronic interstitial lung disease and bibasilar scarring. No acute findings. Electronically Signed   By: Rolm Baptise M.D.   On: 07/21/2015 11:14        Scheduled Meds: . calcium carbonate  1.5 tablet Oral BID  . docusate sodium  100 mg Oral Q M,W,F  . enoxaparin (LOVENOX) injection  40 mg Subcutaneous Q24H  . furosemide  40 mg Oral  Daily  . guaiFENesin  600 mg Oral BID  . levothyroxine  75 mcg Oral QAC breakfast  . loratadine  10 mg Oral Daily  . metoprolol succinate  50 mg Oral BID  . multivitamin with minerals  1 tablet Oral Daily  . pantoprazole  40 mg Oral Daily  . potassium chloride SA  10 mEq Oral BID  . raloxifene  60 mg Oral Daily  . sodium chloride flush  3 mL Intravenous Q12H   Continuous Infusions:       Time spent: 35 minutes    Georganne Siple A, MD [facility] Pager [phone]  If 7PM-7AM, please contact night-coverage [email or web address] Password [email or web address] 07/22/2015, 12:11 PM

## 2015-07-23 DIAGNOSIS — I4891 Unspecified atrial fibrillation: Secondary | ICD-10-CM | POA: Diagnosis not present

## 2015-07-23 DIAGNOSIS — E785 Hyperlipidemia, unspecified: Secondary | ICD-10-CM | POA: Diagnosis not present

## 2015-07-23 DIAGNOSIS — I25119 Atherosclerotic heart disease of native coronary artery with unspecified angina pectoris: Secondary | ICD-10-CM | POA: Diagnosis not present

## 2015-07-23 DIAGNOSIS — I482 Chronic atrial fibrillation: Secondary | ICD-10-CM | POA: Diagnosis not present

## 2015-07-23 DIAGNOSIS — R0600 Dyspnea, unspecified: Secondary | ICD-10-CM | POA: Diagnosis not present

## 2015-07-23 LAB — BASIC METABOLIC PANEL
ANION GAP: 6 (ref 5–15)
BUN: 16 mg/dL (ref 6–20)
CALCIUM: 9 mg/dL (ref 8.9–10.3)
CO2: 27 mmol/L (ref 22–32)
Chloride: 105 mmol/L (ref 101–111)
Creatinine, Ser: 0.85 mg/dL (ref 0.44–1.00)
GFR calc Af Amer: >60 mL/min (ref >60)
GFR calc non Af Amer: 59 mL/min — ABNORMAL LOW (ref >60)
GLUCOSE: 99 mg/dL (ref 65–99)
Potassium: 3.7 mmol/L (ref 3.5–5.1)
Sodium: 138 mmol/L (ref 135–145)

## 2015-07-23 MED ORDER — METOPROLOL SUCCINATE ER 50 MG PO TB24: 50.0000 mg | ORAL_TABLET | Freq: Every day | ORAL | Status: AC

## 2015-07-23 NOTE — Progress Notes (Signed)
Orders received for pt discharge.  Discharge summary printed and reviewed with pt.  Explained medication regimen, and pt had no further questions at this time.  IV removed and site remains clean, dry, intact.  Telemetry removed.  Pt in stable condition and awaiting transport. 

## 2015-07-23 NOTE — Progress Notes (Signed)
[facility], they do not provide HHC services to [place].  TCT Jennette Kettleeal to offer HHC choices, Jennette Kettleeal chose [facility] Abby with [facility] called for arrangements - they do service [place] [facility]. Abelino DerrickB Deondrea Aguado [facility] HospitalRN,MHA,BSN [phone]

## 2015-07-23 NOTE — Evaluation (Addendum)
Physical Therapy Evaluation Patient Details Name: Tina Good MRN: [id number] DOB: 04-10-1924 Today's Date: 07/23/2015   History of Present Illness  Pt is a 82 y/o F who presented w/ several day h/o dyspnea.  In the ED resting heart rates dipped into the 40s.  EKG showed a-fib.  Chest x-ray without acute findings.  Mild COPD, cardiomegaly and probably chornic intersitial lung disease.  Pt's PMH includes LBBB, CHF, MI, CABG, back surgery.     Clinical Impression  Pt admitted with above diagnosis. Pt currently with functional limitations due to the deficits listed below (see PT Problem List). Ms. Kyung RuddKennedy presents w/ mild balance impairments but with introduction of RW only required supervision to ambulate.  HR in 60s and 70s and SpO2 lowest at 91% while ambulating on RA.  She will have intermittent assist at home at d/c and was functionally Ind PTA.  Pt will benefit from skilled PT to increase their independence and safety with mobility to allow discharge to the venue listed below.      Follow Up Recommendations Home health PT;Supervision - Intermittent    Equipment Recommendations  None recommended by PT    Recommendations for Other Services       Precautions / Restrictions Precautions Precautions: Fall Precaution Comments: Monitor O2, HR Restrictions Weight Bearing Restrictions: No      Mobility  Bed Mobility Overal bed mobility: Modified Independent             General bed mobility comments: Min increased time. No cues or physical assist needed  Transfers Overall transfer level: Needs assistance Equipment used: None;Rolling walker (2 wheeled) Transfers: Sit to/from Stand Sit to Stand: Supervision         General transfer comment: Min guard for safety.  Pt reaches out for table once standing.  Pt using RW for stand>sit and does not require cues for safe technique.  Ambulation/Gait Ambulation/Gait assistance: Supervision;Min guard Ambulation Distance (Feet): 200  Feet Assistive device: Rolling walker (2 wheeled);None Gait Pattern/deviations: Step-through pattern;Decreased stride length;Trunk flexed Gait velocity: decreased   General Gait Details: Trunk slightly flexed and decreased gait speed.  Pt holding onto counter while ambulating in room without AD and pt requests use of RW.  Once RW introduced pt requires supervision for safety but demonstrates safe technique using RW.  Cues for foward gaze.  HR in 60s and 70s and SpO2 remains at or above 91% thorughout duration of session.    Stairs            Wheelchair Mobility    Modified Rankin (Stroke Patients Only)       Balance Overall balance assessment: Needs assistance Sitting-balance support: No upper extremity supported;Feet supported Sitting balance-Leahy Scale: Good     Standing balance support: Single extremity supported;During functional activity Standing balance-Leahy Scale: Fair Standing balance comment: Pt able to stand without UE support but is unsteady.  Relies on at least 1 UE support to remain steady with static and dynamic activities.                             Pertinent Vitals/Pain Pain Assessment: Faces Faces Pain Scale: Hurts a little bit Pain Location: Lt ankle spider bite Pain Descriptors / Indicators: Nagging Pain Intervention(s): Limited activity within patient's tolerance;Monitored during session;Repositioned    Home Living Family/patient expects to be discharged to:: Private residence Living Arrangements: Alone Available Help at Discharge: Family;Available PRN/intermittently Type of Home: House Home Access: Level entry  Home Layout: One level (has basement but pt never goes down there) Home Equipment: Walker - 2 wheels;Cane - single point;Wheelchair - manual      Prior Function Level of Independence: Independent         Comments: Denies using AD.  Still driving, cooking.  Has hired someone to do the cleaning.     Hand  Dominance        Extremity/Trunk Assessment   Upper Extremity Assessment:  (not formally tested)           Lower Extremity Assessment: Overall WFL for tasks assessed      Cervical / Trunk Assessment: Kyphotic  Communication   Communication: No difficulties  Cognition Arousal/Alertness: Awake/alert Behavior During Therapy: WFL for tasks assessed/performed Overall Cognitive Status: Within Functional Limits for tasks assessed                      General Comments General comments (skin integrity, edema, etc.): Pt reports she is planning to return to her home at d/c.  Encouraged pt to use RW at home at d/c and work with HHPT before attempting to ambulate without AD, pt verbalized understanding.    Exercises        Assessment/Plan    PT Assessment Patient needs continued PT services  PT Diagnosis Difficulty walking   PT Problem List Decreased balance;Decreased mobility;Decreased knowledge of use of DME;Decreased safety awareness  PT Treatment Interventions DME instruction;Gait training;Functional mobility training;Therapeutic activities;Therapeutic exercise;Balance training;Patient/family education   PT Goals (Current goals can be found in the Care Plan section) Acute Rehab PT Goals Patient Stated Goal: to go home PT Goal Formulation: With patient Time For Goal Achievement: 08/06/15 Potential to Achieve Goals: Good    Frequency Min 3X/week   Barriers to discharge        Co-evaluation               End of Session Equipment Utilized During Treatment: Gait belt Activity Tolerance: Patient tolerated treatment well Patient left: in chair;with call bell/phone within reach;with chair alarm set Nurse Communication: Mobility status;Other (comment) (SpO2, HR)    Functional Assessment Tool Used: Clinical Judgement Functional Limitation: Mobility: Walking and moving around Mobility: Walking and Moving Around Current Status [phone]([id number]): At least 1 percent but  less than 20 percent impaired, limited or restricted Mobility: Walking and Moving Around Goal Status [phone]([id number]): At least 1 percent but less than 20 percent impaired, limited or restricted    Time: 1058-1120 PT Time Calculation (min) (ACUTE ONLY): 22 min   Charges:   PT Evaluation $PT Eval Low Complexity: 1 Procedure     PT G Codes:   PT G-Codes **NOT FOR INPATIENT CLASS** Functional Assessment Tool Used: Clinical Judgement Functional Limitation: Mobility: Walking and moving around Mobility: Walking and Moving Around Current Status ([id number]([id number]): At least 1 percent but less than 20 percent impaired, limited or restricted Mobility: Walking and Moving Around Goal Status [phone]([id number]): At least 1 percent but less than 20 percent impaired, limited or restricted    Encarnacion ChuAshley Marilynne Dupuis PT, DPT  Pager: [phone] Phone: [phone] 07/23/2015, 12:39 PM

## 2015-07-23 NOTE — Progress Notes (Signed)
Received message to contact patient's son Jennette Kettleeal [phone]); TCT Jennette KettleNeal, he wants his mother to have HHC at discharge; Call placed on speaker phone so that the patient could participate in the conversation. [facility] and [facility], she does not want a nurses aide; HHC choice offered, patient [facility]. [facility]. Abelino DerrickB Samarrah Tranchina RN,MHA,BSN

## 2015-07-23 NOTE — Discharge Summary (Signed)
Physician Discharge Summary  [name] TTS:[id number] DOB: 1924/08/04 DOA: 07/21/2015  PCP: No primary care provider on file.  Admit date: 07/21/2015 Discharge date: 07/23/2015  Admitted From: Home Disposition: Home  Recommendations for Outpatient Follow-up:  1. Follow up with PCP in 1-2 weeks 2. Please obtain BMP/CBC in one week  Home Health: No Equipment/Devices: None  Discharge Condition: Stable CODE STATUS: Full code Diet recommendation: Heart Healthy  Brief/Interim Summary: Tina Good is a 82 y.o. female from [place], [place] in [place] visiting son. Patient has a past medical history significant for congestive heart failure, CAD / CABG. GERD, hypothyroidism . She presents with several day history of dyspnea. Patient evaluated at [facility] 07/17/15 , advised to double her Lasix for 3 days at home for CHF. Urine output has been good but no significant improvement in breathing. No orthopnea, patient is still able to sleep on one pillow . Dyspnea worse with exertion . Patient has a dry cough , previously this was secondary to GERD . No fevers or chills . No chest pain. She complains of nausea today. Patient should be some nausea to taking her home medications on an empty stomach.   ED course:  Hemodynamically stable, afebrile.  Chest x-ray without acute findings. Mild COPD, cardiomegaly and probable chronic interstitial lung disease BNP 592, troponin negative 0.0 WBC normal, urinalysis essentially unremarkable EKG shows A. fib with slow ventricular response  Discharge Diagnoses:  Principal Problem:   ATRIAL FIBRILLATION Active Problems:   Hyperlipidemia   Hypertension   CAD (coronary artery disease)   Dyspnea   Dyspnea.  -Hx of CHF, echo late June with severe pulmonary HTN, EF 40-45%. Normal O2 sats. -Slightly elevated BNP at 592, ESR showed cardiomegaly and bibasilar scarring. -Given 1 dose of IV Lasix, patient improved. -Monitor in the hospital in very okay,  also breathing treatment. -I think her dyspnea might be secondary to his significant bradycardia on admission.  Bradycardia -Patient listed that she is on Toprol-XL 50 mg twice a day and Lopressor 25 mg twice a day. -She does have atrial fibrillation but heart rate went down to the 30s. -Lopressor discontinued and Toprol-XL dose changed to 50 daily. Heart rate in the 60s at the time of discharge.  LBBB -Twelve-lead EKG showed complete left bundle branch block, this is all since 2013. -Negative cardiac enzymes, denies any chest pain. -2-D echocardiogram to be done.  Prolonged QTc 509.  -Magnesium is okay, this is likely prolonged because of the LBBB  Nausea. Patient attributes to taking meds on empty stomach this am. No chest pain. Negative troponin.  -continue to monitor  Atrial fibrillation, rate controlled. [name] 5 -Previously refused anticoagulation  Hypothyroidism TSH normal in late June. -Continue home Synthroid .  Hypertension, controlled. On Beta blocker -continue beta blocker  GERD. No heartburn. Has dry cough which Pulmonary previously attributed by reflux .   Discharge Instructions  Discharge Instructions    Diet - low sodium heart healthy    Complete by:  As directed      Increase activity slowly    Complete by:  As directed             Medication List    STOP taking these medications        metoprolol tartrate 25 MG tablet  Commonly known as:  LOPRESSOR      TAKE these medications        albuterol 108 (90 Base) MCG/ACT inhaler  Commonly known as:  PROVENTIL HFA;VENTOLIN HFA  Inhale 2 puffs into the lungs every 6 (six) hours as needed for wheezing.     aspirin 81 MG tablet  Take 81 mg by mouth daily.     atorvastatin 20 MG tablet  Commonly known as:  LIPITOR  Take 20 mg by mouth daily.     calcium carbonate 750 MG chewable tablet  Commonly known as:  TUMS EX  Chew 1 tablet by mouth 2 (two) times daily.     docusate sodium 100 MG  capsule  Commonly known as:  COLACE  Take 100 mg by mouth 3 (three) times a week.     furosemide 40 MG tablet  Commonly known as:  LASIX  Take 40 mg by mouth daily.     guaiFENesin 600 MG 12 hr tablet  Commonly known as:  MUCINEX  Take 600 mg by mouth daily as needed. For cough     levothyroxine 75 MCG tablet  Commonly known as:  SYNTHROID, LEVOTHROID  Take 75 mcg by mouth daily.     loratadine 10 MG tablet  Commonly known as:  CLARITIN  Take 10 mg by mouth daily.     metoprolol succinate 50 MG 24 hr tablet  Commonly known as:  TOPROL-XL  Take 1 tablet (50 mg total) by mouth daily.     multivitamin with minerals Tabs tablet  Take 1 tablet by mouth daily.     omeprazole 20 MG capsule  Commonly known as:  PRILOSEC  Take 40 mg by mouth daily.     potassium chloride SA 20 MEQ tablet  Commonly known as:  K-DUR,KLOR-CON  Take 10 mEq by mouth 2 (two) times daily.     raloxifene 60 MG tablet  Commonly known as:  EVISTA  Take 60 mg by mouth daily.     VITAMIN D-3 PO  Take 1 tablet by mouth daily.        Allergies  Allergen Reactions  . Procaine     Increases heart rate     Consultations:  None   Procedures/Studies: Dg Chest 2 View  07/21/2015  CLINICAL DATA:  Increasing shortness of breath. Chest congestion. Dry cough. Chest tightness and pressure for a few weeks. EXAM: CHEST  2 VIEW COMPARISON:  01/17/2012 FINDINGS: Patient is status post CABG. There is mild cardiomegaly. Diffuse interstitial prominence throughout the lungs, likely chronic interstitial lung disease. Mild hyperinflation. Scarring in the lower lobes. Multiple old right rib fractures. Is mild compression fractures in the mid and lower thoracic spine, stable since 2013. IMPRESSION: Cardiomegaly. Mild COPD. Probable chronic interstitial lung disease and bibasilar scarring. No acute findings. Electronically Signed   By: Rolm Baptise M.D.   On: 07/21/2015 11:14    (Echo, Carotid, EGD, Colonoscopy, ERCP)     Subjective:   Discharge Exam: Filed Vitals:   07/23/15 0200 07/23/15 0700  BP: 156/58 149/62  Pulse: 56 60  Temp: 98.1 F (36.7 C) 98.4 F (36.9 C)  Resp: 18    Filed Vitals:   07/22/15 1028 07/22/15 2027 07/23/15 0200 07/23/15 0700  BP: 157/64 140/56 156/58 149/62  Pulse: 96 63 56 60  Temp: 97.7 F (36.5 C) 98.2 F (36.8 C) 98.1 F (36.7 C) 98.4 F (36.9 C)  TempSrc: Oral Oral Oral Oral  Resp: 16 17 18    Height:      Weight:    59.104 kg (130 lb 4.8 oz)  SpO2: 95% 98% 96% 98%    General: Pt is alert, awake, not in acute distress Cardiovascular:  RRR, S1/S2 +, no rubs, no gallops Respiratory: CTA bilaterally, no wheezing, no rhonchi Abdominal: Soft, NT, ND, bowel sounds + Extremities: no edema, no cyanosis    The results of significant diagnostics from this hospitalization (including imaging, microbiology, ancillary and laboratory) are listed below for reference.     Microbiology: No results found for this or any previous visit (from the past 240 hour(s)).   Labs: BNP (last 3 results)  Recent Labs  07/21/15 1020  BNP 677.3*   Basic Metabolic Panel:  Recent Labs Lab 07/21/15 1020 07/22/15 0144 07/22/15 1239 07/23/15 0352  NA 137 138  --  138  K 4.6 3.7  --  3.7  CL 103 103  --  105  CO2 27 27  --  27  GLUCOSE 121* 98  --  99  BUN 19 19  --  16  CREATININE 0.95 1.01*  --  0.85  CALCIUM 9.2 9.0  --  9.0  MG  --   --  2.2  --    Liver Function Tests:  Recent Labs Lab 07/21/15 1045  AST 21  ALT 18  ALKPHOS 52  BILITOT 0.6  PROT 7.5  ALBUMIN 3.6    Recent Labs Lab 07/21/15 1045  LIPASE 17   No results for input(s): AMMONIA in the last 168 hours. CBC:  Recent Labs Lab 07/21/15 1020 07/22/15 0144  WBC 7.7 7.2  HGB 13.0 12.1  HCT 39.1 37.2  MCV 94.2 93.0  PLT 185 182   Cardiac Enzymes:  Recent Labs Lab 07/21/15 1406 07/21/15 1955 07/22/15 0144  TROPONINI <0.03 <0.03 <0.03   BNP: Invalid input(s):  POCBNP CBG: No results for input(s): GLUCAP in the last 168 hours. D-Dimer No results for input(s): DDIMER in the last 72 hours. Hgb A1c No results for input(s): HGBA1C in the last 72 hours. Lipid Profile No results for input(s): CHOL, HDL, LDLCALC, TRIG, CHOLHDL, LDLDIRECT in the last 72 hours. Thyroid function studies  Recent Labs  07/21/15 1045  TSH 1.841   Anemia work up No results for input(s): VITAMINB12, FOLATE, FERRITIN, TIBC, IRON, RETICCTPCT in the last 72 hours. Urinalysis    Component Value Date/Time   COLORURINE YELLOW 07/21/2015 1145   APPEARANCEUR CLEAR 07/21/2015 1145   LABSPEC 1.011 07/21/2015 1145   PHURINE 7.5 07/21/2015 1145   GLUCOSEU NEGATIVE 07/21/2015 1145   HGBUR NEGATIVE 07/21/2015 1145   BILIRUBINUR NEGATIVE 07/21/2015 1145   KETONESUR NEGATIVE 07/21/2015 1145   PROTEINUR NEGATIVE 07/21/2015 1145   UROBILINOGEN 1.0 01/17/2012 0752   NITRITE NEGATIVE 07/21/2015 1145   LEUKOCYTESUR TRACE* 07/21/2015 1145   Sepsis Labs Invalid input(s): PROCALCITONIN,  WBC,  LACTICIDVEN Microbiology No results found for this or any previous visit (from the past 240 hour(s)).   Time coordinating discharge: Over 30 minutes  SIGNED:   Birdie Hopes, MD  [facility] 07/23/2015, 11:52 AM Pager   If 7PM-7AM, please contact night-coverage [email or web address] Password [email or web address]

## 2015-09-15 ENCOUNTER — Encounter (HOSPITAL_COMMUNITY): Payer: Self-pay

## 2016-04-16 DEATH — deceased
